# Patient Record
Sex: Male | Born: 1960
Health system: Southern US, Community
[De-identification: ages and names within clinical notes are randomized; demographics above are authoritative.]

## PROBLEM LIST (undated history)

## (undated) DIAGNOSIS — T7840XA Allergy, unspecified, initial encounter: Secondary | ICD-10-CM

## (undated) DIAGNOSIS — G250 Essential tremor: Secondary | ICD-10-CM

## (undated) DIAGNOSIS — F329 Major depressive disorder, single episode, unspecified: Secondary | ICD-10-CM

## (undated) DIAGNOSIS — R3911 Hesitancy of micturition: Secondary | ICD-10-CM

## (undated) DIAGNOSIS — M25569 Pain in unspecified knee: Secondary | ICD-10-CM

## (undated) DIAGNOSIS — G709 Myoneural disorder, unspecified: Secondary | ICD-10-CM

## (undated) DIAGNOSIS — C61 Malignant neoplasm of prostate: Secondary | ICD-10-CM

## (undated) DIAGNOSIS — M7500 Adhesive capsulitis of unspecified shoulder: Secondary | ICD-10-CM

## (undated) DIAGNOSIS — D179 Benign lipomatous neoplasm, unspecified: Secondary | ICD-10-CM

## (undated) DIAGNOSIS — J45909 Unspecified asthma, uncomplicated: Secondary | ICD-10-CM

## (undated) DIAGNOSIS — F32A Depression, unspecified: Secondary | ICD-10-CM

## (undated) DIAGNOSIS — L409 Psoriasis, unspecified: Secondary | ICD-10-CM

## (undated) DIAGNOSIS — E785 Hyperlipidemia, unspecified: Secondary | ICD-10-CM

## (undated) DIAGNOSIS — K219 Gastro-esophageal reflux disease without esophagitis: Secondary | ICD-10-CM

## (undated) DIAGNOSIS — R2689 Other abnormalities of gait and mobility: Secondary | ICD-10-CM

## (undated) HISTORY — DX: Psoriasis, unspecified: L40.9

## (undated) HISTORY — PX: UPPER GASTROINTESTINAL ENDOSCOPY: SHX188

## (undated) HISTORY — PX: COLONOSCOPY: SHX174

## (undated) HISTORY — DX: Adhesive capsulitis of unspecified shoulder: M75.00

## (undated) HISTORY — DX: Benign lipomatous neoplasm, unspecified: D17.9

## (undated) HISTORY — DX: Essential tremor: G25.0

## (undated) HISTORY — DX: Hyperlipidemia, unspecified: E78.5

## (undated) HISTORY — DX: Myoneural disorder, unspecified: G70.9

## (undated) HISTORY — DX: Unspecified asthma, uncomplicated: J45.909

## (undated) HISTORY — DX: Allergy, unspecified, initial encounter: T78.40XA

## (undated) HISTORY — DX: Gastro-esophageal reflux disease without esophagitis: K21.9

## (undated) HISTORY — DX: Other abnormalities of gait and mobility: R26.89

---
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
## 1997-10-07 HISTORY — PX: VASECTOMY: SHX75

## 2000-09-17 ENCOUNTER — Encounter: Payer: Self-pay | Admitting: Internal Medicine

## 2000-09-17 ENCOUNTER — Encounter: Admission: RE | Admit: 2000-09-17 | Discharge: 2000-09-17 | Payer: Self-pay | Admitting: Internal Medicine

## 2001-12-11 ENCOUNTER — Encounter: Payer: Self-pay | Admitting: Internal Medicine

## 2001-12-11 ENCOUNTER — Encounter: Admission: RE | Admit: 2001-12-11 | Discharge: 2001-12-11 | Payer: Self-pay | Admitting: Internal Medicine

## 2002-01-08 ENCOUNTER — Encounter: Admission: RE | Admit: 2002-01-08 | Discharge: 2002-04-08 | Payer: Self-pay | Admitting: Internal Medicine

## 2008-08-25 ENCOUNTER — Ambulatory Visit: Payer: Self-pay | Admitting: Internal Medicine

## 2009-08-01 ENCOUNTER — Ambulatory Visit: Payer: Self-pay | Admitting: Internal Medicine

## 2009-10-19 ENCOUNTER — Ambulatory Visit: Payer: Self-pay | Admitting: Internal Medicine

## 2009-10-30 ENCOUNTER — Ambulatory Visit: Payer: Self-pay | Admitting: Internal Medicine

## 2010-02-12 ENCOUNTER — Ambulatory Visit: Payer: Self-pay | Admitting: Internal Medicine

## 2010-07-23 ENCOUNTER — Ambulatory Visit: Payer: Self-pay | Admitting: Internal Medicine

## 2010-08-06 ENCOUNTER — Ambulatory Visit: Payer: Self-pay | Admitting: Internal Medicine

## 2010-08-06 ENCOUNTER — Encounter: Admission: RE | Admit: 2010-08-06 | Discharge: 2010-08-06 | Payer: Self-pay | Admitting: Internal Medicine

## 2010-08-10 ENCOUNTER — Ambulatory Visit: Payer: Self-pay | Admitting: Internal Medicine

## 2010-08-16 ENCOUNTER — Ambulatory Visit: Payer: Self-pay | Admitting: Internal Medicine

## 2010-08-17 ENCOUNTER — Ambulatory Visit: Payer: Self-pay | Admitting: Internal Medicine

## 2010-09-03 ENCOUNTER — Ambulatory Visit: Payer: Self-pay | Admitting: Internal Medicine

## 2010-09-14 ENCOUNTER — Encounter
Admission: RE | Admit: 2010-09-14 | Discharge: 2010-09-14 | Payer: Self-pay | Source: Home / Self Care | Attending: Allergy | Admitting: Allergy

## 2010-09-27 ENCOUNTER — Ambulatory Visit: Payer: Self-pay | Admitting: Internal Medicine

## 2010-10-19 ENCOUNTER — Ambulatory Visit
Admission: RE | Admit: 2010-10-19 | Discharge: 2010-10-19 | Payer: Self-pay | Source: Home / Self Care | Admitting: Internal Medicine

## 2010-11-15 ENCOUNTER — Ambulatory Visit (INDEPENDENT_AMBULATORY_CARE_PROVIDER_SITE_OTHER): Payer: 59 | Admitting: Internal Medicine

## 2010-11-15 DIAGNOSIS — J019 Acute sinusitis, unspecified: Secondary | ICD-10-CM

## 2010-11-15 DIAGNOSIS — J309 Allergic rhinitis, unspecified: Secondary | ICD-10-CM

## 2010-11-23 DIAGNOSIS — G250 Essential tremor: Secondary | ICD-10-CM

## 2010-11-23 DIAGNOSIS — G252 Other specified forms of tremor: Secondary | ICD-10-CM

## 2011-01-24 ENCOUNTER — Ambulatory Visit (INDEPENDENT_AMBULATORY_CARE_PROVIDER_SITE_OTHER): Payer: 59 | Admitting: Internal Medicine

## 2011-01-24 DIAGNOSIS — J309 Allergic rhinitis, unspecified: Secondary | ICD-10-CM

## 2011-02-20 ENCOUNTER — Telehealth: Payer: Self-pay | Admitting: Internal Medicine

## 2011-02-20 NOTE — Telephone Encounter (Signed)
Offer visit tomorrow or Urgent Care visit today.

## 2011-02-20 NOTE — Telephone Encounter (Signed)
Spoke with patient. Had shingles at age 50. Broke out in rash yesterday, but no vesicles visible.Minimal pain. Does not want to go to Urgent Care. Says he can wait till tomorrow. No fever, but uri symptoms Monday.

## 2011-02-20 NOTE — Telephone Encounter (Signed)
Tried to call patient back today to tell him we would work him in tomorrow. No answer. Message left to call

## 2011-02-21 ENCOUNTER — Encounter: Payer: Self-pay | Admitting: Internal Medicine

## 2011-02-21 ENCOUNTER — Ambulatory Visit (INDEPENDENT_AMBULATORY_CARE_PROVIDER_SITE_OTHER): Payer: 59 | Admitting: Internal Medicine

## 2011-02-21 DIAGNOSIS — B029 Zoster without complications: Secondary | ICD-10-CM

## 2011-02-21 MED ORDER — VALACYCLOVIR HCL 1 G PO TABS: 1000.0000 mg | ORAL_TABLET | Freq: Three times a day (TID) | ORAL | Status: AC

## 2011-02-21 NOTE — Patient Instructions (Signed)
Take  Valtrex as directed. May use Calamine tid topically. Return as needed.

## 2011-02-21 NOTE — Telephone Encounter (Signed)
Patient coming for ov today at 10::00 am

## 2011-02-21 NOTE — Progress Notes (Signed)
  Subjective:    Patient ID: Patrick Sanchez, male    DOB: [birth year], 50 y.o.   MRN: [id number]  HPI Started yesterday, red vesicular rash below left nipple on left trunk. Very fatigued.    Review of Systems     Objective:   Physical Exam    4cm vesicular erythematous rash left anterior trunk    Assessment & Plan:  Herpes zoster- treat with Valtrex one gram tid x 7 days by mouth

## 2011-02-28 ENCOUNTER — Telehealth: Payer: Self-pay

## 2011-02-28 NOTE — Telephone Encounter (Signed)
These types of symptoms may go on for several weeks as nerve pain. Not much to do for it. Pain meds do very little for nerve root pain.

## 2011-02-28 NOTE — Telephone Encounter (Signed)
Patient informed. 

## 2011-03-12 ENCOUNTER — Telehealth: Payer: Self-pay | Admitting: Internal Medicine

## 2011-03-12 NOTE — Telephone Encounter (Signed)
Spoke with patient. Advised that he most likely has a reactive lymph node, probably from recent shingles outbreak. He is afebrile. To monitor lump for next 3 weeks, and call back if it is still present.

## 2011-04-30 ENCOUNTER — Ambulatory Visit (INDEPENDENT_AMBULATORY_CARE_PROVIDER_SITE_OTHER): Payer: 59 | Admitting: Internal Medicine

## 2011-04-30 ENCOUNTER — Encounter: Payer: Self-pay | Admitting: Internal Medicine

## 2011-04-30 DIAGNOSIS — F329 Major depressive disorder, single episode, unspecified: Secondary | ICD-10-CM

## 2011-04-30 DIAGNOSIS — L02436 Carbuncle of left lower limb: Secondary | ICD-10-CM

## 2011-04-30 DIAGNOSIS — L02429 Furuncle of limb, unspecified: Secondary | ICD-10-CM

## 2011-04-30 DIAGNOSIS — E785 Hyperlipidemia, unspecified: Secondary | ICD-10-CM

## 2011-04-30 DIAGNOSIS — J309 Allergic rhinitis, unspecified: Secondary | ICD-10-CM | POA: Insufficient documentation

## 2011-04-30 DIAGNOSIS — K219 Gastro-esophageal reflux disease without esophagitis: Secondary | ICD-10-CM

## 2011-04-30 DIAGNOSIS — G250 Essential tremor: Secondary | ICD-10-CM

## 2011-04-30 DIAGNOSIS — F32A Depression, unspecified: Secondary | ICD-10-CM

## 2011-04-30 DIAGNOSIS — F3289 Other specified depressive episodes: Secondary | ICD-10-CM

## 2011-04-30 NOTE — Patient Instructions (Signed)
Take Keflex 4 times daily for 7 days. Call if not better in 10 days

## 2011-04-30 NOTE — Progress Notes (Signed)
  Subjective:    Patient ID: Patrick Sanchez, male    DOB: 1961-08-08, 50 y.o.   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 central tremor, hyperlipidemia, psoriasis, depression. Over the past several months he's had considerable problems with respiratory infection symptoms. He has been unable to work. He was allergy tested by Dr. Gordon Callas. Was diagnosed with asthma and allergic rhinitis with significant reaction to perennial allergens. Allergy vaccinations were recommended but he has not started those despite having been evaluated December 2011 he had lots of issues with respiratory congestion and inability to work in November 2011. He applied for disability through his employment and he says only part of that was paid. The symptoms continued into December. He had allergic bronchitis and serous otitis media with fatigue. Mother has pancreatic cancer of [place] she's doing fairly well. She was diagnosed late last year. In February 2012, we saw TMs for sinusitis allergic rhinitis and fatigue. He was treated with steroids and Avelox. We saw him again 01/24/1999 coiled with URI symptoms. Was diagnosed with allergic rhinitis sinusitis and serous otitis media. Was treated with Levaquin. He's also had some issues with his daughter who had a rough year in school. She is transferring to a new school this fall. Nail says that he's had some pain in his left axilla and has noticed what he thinks is a swollen lymph node in his left groin area. Continues to be fatigued. He had lab work 08/06/2010 consisting of CBC which was normal and a TSH which was normal. Sinemet was done 08/16/2010 and was normal. Chest x-ray was done 08/06/2010 which was normal. Seems depressed about his job. Under financial stress. Is on Pristiq for depression.     Review of Systems     Objective:   Physical Exam HEENT exam: TMs and pharynx are clear. Neck: Supple  without thyromegaly or adenopathy. Left axilla no adenopathy appreciated but patient complains of pain with palpation. Chest clear. No hepatosplenomegaly masses or tenderness on abdominal exam. Left groin no adenopathy. Carbuncle noted left upper inner thigh.        Assessment & Plan:  Fatigue  Depression  Carbuncle left upper inner thigh  Allergic rhinitis  GE reflux  Hyperlipidemia  Essential tremor  Plan: Treat carbuncle with Keflex 500 mg 4 times daily for 7 days. Reassured patient he has no significant adenopathy. No lab work done today. Patient also has questions about his short-term disability claim being denied. He was given copies of his medical records over the past several months.

## 2011-07-12 ENCOUNTER — Other Ambulatory Visit: Payer: Self-pay

## 2011-07-12 MED ORDER — MONTELUKAST SODIUM 10 MG PO TABS: 10.0000 mg | ORAL_TABLET | Freq: Every day | ORAL | Status: DC

## 2011-09-26 ENCOUNTER — Ambulatory Visit (INDEPENDENT_AMBULATORY_CARE_PROVIDER_SITE_OTHER): Payer: 59 | Admitting: Internal Medicine

## 2011-09-26 ENCOUNTER — Encounter: Payer: Self-pay | Admitting: Internal Medicine

## 2011-09-26 VITALS — BP 149/94 | HR 116 | Temp 100.2°F | Wt 157.0 lb

## 2011-09-26 DIAGNOSIS — J111 Influenza due to unidentified influenza virus with other respiratory manifestations: Secondary | ICD-10-CM

## 2011-09-26 DIAGNOSIS — J101 Influenza due to other identified influenza virus with other respiratory manifestations: Secondary | ICD-10-CM

## 2011-09-26 DIAGNOSIS — J40 Bronchitis, not specified as acute or chronic: Secondary | ICD-10-CM

## 2011-09-26 NOTE — Progress Notes (Signed)
  Subjective:    Patient ID: Patrick Sanchez, male    DOB: [birth year], 50 y.o.   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 Did not take influenza immunization this year. Was out of work yesterday and today. Scratchy throat. No wheezing. No shortness of breath.    Review of Systems     Objective:   Physical Exam HEENT exam: Pharynx slightly injected; TMs clear; neck is supple; chest clear. Has congested cough.        Assessment & Plan:  Influenza  Plan: Levaquin 500 milligrams daily for 10 days; Hycodan 8 ounces 1 teaspoon by mouth every 6 hours when necessary cough; Tamiflu 75 mg twice daily for 5 days. Note to be out of work until without fever for 24 hours.

## 2011-09-26 NOTE — Patient Instructions (Signed)
Take Tylenol as needed for fever. To start Tamiflu 75 mg twice daily for 5 days. Take Levaquin for bronchitis for 10 days. Use cough syrup sparingly. Return to work when without fever for 24 hours.

## 2011-12-30 ENCOUNTER — Ambulatory Visit (INDEPENDENT_AMBULATORY_CARE_PROVIDER_SITE_OTHER): Payer: 59 | Admitting: Internal Medicine

## 2011-12-30 ENCOUNTER — Encounter: Payer: Self-pay | Admitting: Internal Medicine

## 2011-12-30 VITALS — BP 156/96 | HR 92 | Temp 98.8°F | Wt 162.0 lb

## 2011-12-30 DIAGNOSIS — J40 Bronchitis, not specified as acute or chronic: Secondary | ICD-10-CM

## 2011-12-30 DIAGNOSIS — J329 Chronic sinusitis, unspecified: Secondary | ICD-10-CM

## 2012-01-04 NOTE — Patient Instructions (Signed)
Take antibiotics as prescribed. Cough medicine if needed for cough. Return if not better in 2 weeks

## 2012-01-04 NOTE — Progress Notes (Signed)
  Subjective:    Patient ID: Patrick Sanchez, male    DOB: 1960/11/24, 51 y.o.   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 History of frequent respiratory infections. Has come down with respiratory congestion, sore throat, cough and fatigue. History of GE reflux and depression.    Review of Systems     Objective:   Physical Exam HEENT exam: Pharynx is slightly injected. TMs are clear. He sounds hoarse and congested. Neck is supple without significant adenopathy. Chest clear.        Assessment & Plan:  Sinusitis  Bronchitis  Plan: Levaquin 500 milligrams daily for 10 days. Hycodan 8 ounces 1 teaspoon by mouth every 6 hours when necessary cough.

## 2012-04-01 ENCOUNTER — Other Ambulatory Visit: Payer: Self-pay | Admitting: Internal Medicine

## 2012-04-02 ENCOUNTER — Other Ambulatory Visit: Payer: Self-pay

## 2012-04-02 MED ORDER — FLUTICASONE PROPIONATE 50 MCG/ACT NA SUSP: 2.0000 | Freq: Every day | NASAL | Status: DC

## 2012-05-08 ENCOUNTER — Ambulatory Visit (INDEPENDENT_AMBULATORY_CARE_PROVIDER_SITE_OTHER): Payer: 59 | Admitting: Internal Medicine

## 2012-05-08 ENCOUNTER — Encounter: Payer: Self-pay | Admitting: Internal Medicine

## 2012-05-08 VITALS — BP 140/96 | HR 76 | Temp 98.4°F | Wt 155.5 lb

## 2012-05-08 DIAGNOSIS — R2230 Localized swelling, mass and lump, unspecified upper limb: Secondary | ICD-10-CM

## 2012-05-08 DIAGNOSIS — R229 Localized swelling, mass and lump, unspecified: Secondary | ICD-10-CM

## 2012-05-08 NOTE — Progress Notes (Signed)
  Subjective:    Patient ID: Patrick Sanchez, male    DOB: 30-Sep-1961, 51 y.o.   MRN: [id number]  HPI 51 year old white male with history of essential tremor came in acutely late this afternoon saying that he took a shower and noticed a bulge in his right triceps muscle area. Denies pain  or injury. No history of heavy lifting. Does not lift weights for exercise. In fact doesn't exercise at all. Doesn't recall any insect bites.  History of GE reflux and anxiety. History of hyperlipidemia.  Patient looked online and thought he might have a triceps rupture.  Review of Systems     Objective:   Physical Exam patient has bulge right triceps area. There is no erythema, no evidence of insect bite, no increased warmth. There is no right axillary adenopathy. There is no pain to palpation. Mass is soft and mobile. Borders are not palpable. There is no right upper extremity weakness. Muscle strength is 5 over 5 in all retention in the right upper extremity.        Assessment & Plan:  Mass right triceps area  Plan: Patient will go to [facility] walk-in clinic at 5:30 PM for further evaluation.

## 2012-05-08 NOTE — Patient Instructions (Addendum)
Go to [facility] walk-in clinic at 5:30 PM to evaluate triceps area.

## 2012-05-11 ENCOUNTER — Telehealth: Payer: Self-pay | Admitting: Internal Medicine

## 2012-05-11 ENCOUNTER — Telehealth: Payer: Self-pay

## 2012-05-11 NOTE — Telephone Encounter (Signed)
Patient saw physicians assistant at sports medicine clinic Friday but did not have full evaluation. Apparently office was in the process of moving. They indicated he would need to see orthopedist. We have arranged for him to see Dr. Darrelyn Hillock at 2 PM today.

## 2012-05-11 NOTE — Telephone Encounter (Signed)
Note done per Dr. Lenord Fellers

## 2012-07-06 DIAGNOSIS — K219 Gastro-esophageal reflux disease without esophagitis: Secondary | ICD-10-CM | POA: Insufficient documentation

## 2012-07-06 DIAGNOSIS — D179 Benign lipomatous neoplasm, unspecified: Secondary | ICD-10-CM | POA: Insufficient documentation

## 2012-07-06 DIAGNOSIS — F1010 Alcohol abuse, uncomplicated: Secondary | ICD-10-CM | POA: Insufficient documentation

## 2013-06-10 ENCOUNTER — Ambulatory Visit (INDEPENDENT_AMBULATORY_CARE_PROVIDER_SITE_OTHER): Payer: BC Managed Care – PPO | Admitting: Internal Medicine

## 2013-06-10 ENCOUNTER — Encounter: Payer: Self-pay | Admitting: Internal Medicine

## 2013-06-10 VITALS — BP 130/90 | Temp 97.9°F | Wt 152.0 lb

## 2013-06-10 DIAGNOSIS — F3289 Other specified depressive episodes: Secondary | ICD-10-CM

## 2013-06-10 DIAGNOSIS — F32A Depression, unspecified: Secondary | ICD-10-CM

## 2013-06-10 DIAGNOSIS — F329 Major depressive disorder, single episode, unspecified: Secondary | ICD-10-CM

## 2013-06-10 DIAGNOSIS — D1779 Benign lipomatous neoplasm of other sites: Secondary | ICD-10-CM

## 2013-06-10 DIAGNOSIS — M19079 Primary osteoarthritis, unspecified ankle and foot: Secondary | ICD-10-CM

## 2013-06-10 DIAGNOSIS — D1720 Benign lipomatous neoplasm of skin and subcutaneous tissue of unspecified limb: Secondary | ICD-10-CM

## 2013-06-10 MED ORDER — BUPROPION HCL ER (XL) 300 MG PO TB24: 300.0000 mg | ORAL_TABLET | Freq: Every day | ORAL | Status: DC

## 2013-06-14 NOTE — Patient Instructions (Addendum)
Reassure him about lipoma of arm and bony prominence metatarsal aspect of foot. Start Wellbutrin XL 300 mg daily. Call with progress report in 4-6 weeks

## 2013-06-14 NOTE — Progress Notes (Signed)
  Subjective:    Patient ID: Patrick Sanchez, male    DOB: [birth year], 52 y.o.   MRN: [id number]  HPI 52 year old White male has been unemployed for the past 11 months. 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 Says the past several months have allowed him to spend time with his younger daughter who was having some mental issues. Says she is doing much better. He is now starting to look for work. He had thought about returning to school at [employer]. Has become a bit depressed recently. He used to take Wellbutrin. He has an upcoming court date for DUI charge which has been put off for almost a year. Denies drinking to excess.  Also has 5 inch mass right inner arm which is not changed. He merely wants it looked at. Appears to be a lipoma. It is not tender to palpation or causing him any problems.  Also has nodule bottom of his foot which is probably a bony prominence and once again is not tender or causing him any problems    Review of Systems     Objective:   Physical Exam spent 25 minutes speaking with patient about his family situation and job situation. He has no suicidal ideations. He merely is somewhat depressed about his job situation. Lipoma right inner upper arm noted. Bony prominence on the foot which seems to be benign distal metatarsal area.        Assessment & Plan:  Depression  Bony prominence plantar aspect of left foot  Lipoma right upper arm  Plan: Begin Wellbutrin XL 300 mg generic daily. Call in 4-6 weeks with progress report on this.

## 2014-02-07 ENCOUNTER — Other Ambulatory Visit: Payer: Self-pay

## 2014-02-07 ENCOUNTER — Telehealth: Payer: Self-pay | Admitting: Internal Medicine

## 2014-02-07 MED ORDER — BUPROPION HCL ER (XL) 300 MG PO TB24: 300.0000 mg | ORAL_TABLET | Freq: Every day | ORAL | Status: DC

## 2014-02-07 NOTE — Telephone Encounter (Signed)
Linda e-scribed refill for 90 days [facility].  Wife informed.

## 2014-07-24 ENCOUNTER — Other Ambulatory Visit: Payer: Self-pay | Admitting: Internal Medicine

## 2014-08-16 ENCOUNTER — Other Ambulatory Visit: Payer: BC Managed Care – PPO | Admitting: Internal Medicine

## 2014-08-16 DIAGNOSIS — E785 Hyperlipidemia, unspecified: Secondary | ICD-10-CM

## 2014-08-16 DIAGNOSIS — Z0000 Encounter for general adult medical examination without abnormal findings: Secondary | ICD-10-CM

## 2014-08-16 DIAGNOSIS — Z13220 Encounter for screening for lipoid disorders: Secondary | ICD-10-CM

## 2014-08-16 DIAGNOSIS — Z125 Encounter for screening for malignant neoplasm of prostate: Secondary | ICD-10-CM

## 2014-08-16 DIAGNOSIS — Z130 Encounter for screening for diseases of the blood and blood-forming organs and certain disorders involving the immune mechanism: Secondary | ICD-10-CM

## 2014-08-16 LAB — CBC WITH DIFFERENTIAL/PLATELET
Basophils Absolute: 0.1 10*3/uL (ref 0.0–0.1)
Basophils Relative: 1 % (ref 0–1)
Eosinophils Absolute: 0.2 10*3/uL (ref 0.0–0.7)
Eosinophils Relative: 3 % (ref 0–5)
HCT: 47.9 % (ref 39.0–52.0)
Hemoglobin: 16.6 g/dL (ref 13.0–17.0)
Lymphocytes Relative: 22 % (ref 12–46)
Lymphs Abs: 1.6 10*3/uL (ref 0.7–4.0)
MCH: 31.1 pg (ref 26.0–34.0)
MCHC: 34.7 g/dL (ref 30.0–36.0)
MCV: 89.9 fL (ref 78.0–100.0)
Monocytes Absolute: 0.5 10*3/uL (ref 0.1–1.0)
Monocytes Relative: 7 % (ref 3–12)
Neutro Abs: 4.8 10*3/uL (ref 1.7–7.7)
Neutrophils Relative %: 67 % (ref 43–77)
Platelets: 236 10*3/uL (ref 150–400)
RBC: 5.33 MIL/uL (ref 4.22–5.81)
RDW: 12.8 % (ref 11.5–15.5)
WBC: 7.1 10*3/uL (ref 4.0–10.5)

## 2014-08-16 LAB — LIPID PANEL
Cholesterol: 248 mg/dL — ABNORMAL HIGH (ref 0–200)
HDL: 47 mg/dL (ref >39)
LDL Cholesterol: 175 mg/dL — ABNORMAL HIGH (ref 0–99)
Total CHOL/HDL Ratio: 5.3 Ratio
Triglycerides: 131 mg/dL (ref <150)
VLDL: 26 mg/dL (ref 0–40)

## 2014-08-16 LAB — COMPREHENSIVE METABOLIC PANEL
ALT: 25 U/L (ref 0–53)
AST: 17 U/L (ref 0–37)
Albumin: 4.1 g/dL (ref 3.5–5.2)
Alkaline Phosphatase: 55 U/L (ref 39–117)
BUN: 14 mg/dL (ref 6–23)
CO2: 28 mEq/L (ref 19–32)
Calcium: 9.3 mg/dL (ref 8.4–10.5)
Chloride: 100 mEq/L (ref 96–112)
Creat: 0.99 mg/dL (ref 0.50–1.35)
Glucose, Bld: 93 mg/dL (ref 70–99)
Potassium: 4.3 mEq/L (ref 3.5–5.3)
Sodium: 137 mEq/L (ref 135–145)
Total Bilirubin: 0.4 mg/dL (ref 0.2–1.2)
Total Protein: 6.4 g/dL (ref 6.0–8.3)

## 2014-08-17 LAB — PSA: PSA: 9.47 ng/mL — ABNORMAL HIGH (ref <=4.00)

## 2014-09-20 ENCOUNTER — Ambulatory Visit (INDEPENDENT_AMBULATORY_CARE_PROVIDER_SITE_OTHER): Payer: BC Managed Care – PPO | Admitting: Internal Medicine

## 2014-09-20 ENCOUNTER — Telehealth: Payer: Self-pay

## 2014-09-20 ENCOUNTER — Encounter: Payer: Self-pay | Admitting: Internal Medicine

## 2014-09-20 ENCOUNTER — Ambulatory Visit
Admission: RE | Admit: 2014-09-20 | Discharge: 2014-09-20 | Disposition: A | Discharge status: Home / Self Care | Payer: BC Managed Care – PPO | Source: Ambulatory Visit | Attending: Internal Medicine | Admitting: Internal Medicine

## 2014-09-20 VITALS — BP 116/76 | HR 84 | Temp 97.4°F | Wt 148.0 lb

## 2014-09-20 DIAGNOSIS — R0789 Other chest pain: Secondary | ICD-10-CM

## 2014-09-20 DIAGNOSIS — R222 Localized swelling, mass and lump, trunk: Secondary | ICD-10-CM

## 2014-09-20 NOTE — Telephone Encounter (Signed)
Patient informed of xray results.  Advised to take aleve bid x 10 days.  CT if no better.

## 2014-09-20 NOTE — Telephone Encounter (Signed)
-----   Message from Elby Showers, MD sent at 09/20/2014  3:47 PM EST ----- I think this is related to rib/ sternum. Take Aleve twice daily x 10 days and if no better, we can get CT of that area.

## 2014-10-03 ENCOUNTER — Ambulatory Visit (INDEPENDENT_AMBULATORY_CARE_PROVIDER_SITE_OTHER): Payer: BC Managed Care – PPO | Admitting: Internal Medicine

## 2014-10-03 ENCOUNTER — Encounter: Payer: Self-pay | Admitting: Internal Medicine

## 2014-10-03 VITALS — BP 130/80 | HR 86 | Temp 98.0°F | Wt 146.0 lb

## 2014-10-03 DIAGNOSIS — Z872 Personal history of diseases of the skin and subcutaneous tissue: Secondary | ICD-10-CM

## 2014-10-03 DIAGNOSIS — G250 Essential tremor: Secondary | ICD-10-CM

## 2014-10-03 DIAGNOSIS — Z0000 Encounter for general adult medical examination without abnormal findings: Secondary | ICD-10-CM

## 2014-10-03 DIAGNOSIS — R972 Elevated prostate specific antigen [PSA]: Secondary | ICD-10-CM

## 2014-10-03 DIAGNOSIS — E785 Hyperlipidemia, unspecified: Secondary | ICD-10-CM

## 2014-10-03 MED ORDER — SIMVASTATIN 10 MG PO TABS: 10.0000 mg | ORAL_TABLET | Freq: Every day | ORAL | Status: DC

## 2014-10-03 NOTE — Patient Instructions (Signed)
Patient reassured this does not appear to be be a serious lesion. Continue to observe. Has appointment for physical exam in the near future and we can follow-up at that time.

## 2014-10-03 NOTE — Progress Notes (Signed)
   Subjective:    Patient ID: Patrick Sanchez, male    DOB: 10-18-1960, 53 y.o.   MRN: [id number]  HPI  Has recently noted tender prominent painful area in lower chest right rib cage area adjacent to the sternum. Denies any injury.    Review of Systems     Objective:   Physical Exam  Has prominence right lower rib cage area adjacent to right sternum that is tender to touch. Also is tender at xiphoid process.      Assessment & Plan:  Chest wall pain  Plan: Patient is concerned about the prominence he feels as well as the tenderness. Will do x-ray of area to see if there is a lytic lesion or calcification.  Addendum: No significant abnormality noted in the area on x-ray. Continue to observe.

## 2014-10-03 NOTE — Progress Notes (Signed)
Subjective:    Patient ID: Patrick Sanchez, male    DOB: 08/09/1961, 53 y.o.   MRN: [id number]  HPI 53 year old White Male has not been seen here for physical exam in a number of years. He has a history of essential tremor and hyperlipidemia. He is not taking any medication for tremor or hyperlipidemia this point in time. Used to take statin medication for hyperlipidemia. History of GE reflux and allergic rhinitis. History of depression treated with Wellbutrin. Was here recently regarding a "lump "lower right rib cage. This was x-ray and was found not to be significant. It was tender at the time but has improved in tenderness.  He's lost 8 pounds in's 2012.  Social history: He is married. Has 2 daughters. One daughter is living and working in [place]. The other daughter is a Equities trader in high school but wants to live in [place]. 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 He has not worked in a couple of years. He consumes alcohol daily. Does not smoke. Had DUI and lost license for one year.Gets license back soon. Had counseling at [facility] for alcohol consumption.  Family history: Mother died with pancreatic cancer. Father with history of diabetes and MI status post CABG. Last couple of years of his life father was bedridden apparently due to heart failure.  History of allergic rhinitis. Was allergy tested by Dr. Donneta Romberg in the past and had reactions to perennial allergens. Allergy vaccinations were recommended but patient never pursued those. History of psoriasis. Right inguinal hernia repair 1972. Ganglion cyst removed from left wrist 1971. Vasectomy 1999 by Dr. Joelyn Oms who has retired. 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 Right elbow epicondylitis 2003. Negative Cardiolite study in 2003 by Dr. Ron Parker.  He is allergic to Sulfa.  Lipid panel on file from October 2010 showed a total cholesterol of 244 with an LDL  cholesterol of 148 on Zocor 40 mg daily. In 2009 total cholesterol was 293 and LDL cholesterol 200. He was started on Zocor 20 mg daily at that time.     Review of Systems  Constitutional: Negative.   HENT: Negative.   Respiratory: Negative.   Cardiovascular: Negative.   Gastrointestinal: Negative.   Endocrine: Negative.   Genitourinary: Negative for dysuria, urgency, frequency, flank pain and decreased urine volume.  Allergic/Immunologic: Positive for environmental allergies.  Neurological:       Essential tremor left hand worse than right  Psychiatric/Behavioral:       History of depression       Objective:   Physical Exam  Constitutional: He is oriented to person, place, and time. He appears well-developed and well-nourished. No distress.  HENT:  Head: Normocephalic and atraumatic.  Right Ear: External ear normal.  Left Ear: External ear normal.  Mouth/Throat: Oropharynx is clear and moist.  Eyes: Conjunctivae and EOM are normal. Pupils are equal, round, and reactive to light. Right eye exhibits no discharge. Left eye exhibits no discharge. No scleral icterus.  Neck: Neck supple. No tracheal deviation present.  Cardiovascular: Normal rate, regular rhythm, normal heart sounds and intact distal pulses.   No murmur heard. Pulmonary/Chest: Effort normal and breath sounds normal. He has no wheezes. He has no rales.  Abdominal: Soft. Bowel sounds are normal. He exhibits no distension and no mass. There is no tenderness. There is no rebound and no guarding.  Genitourinary:  Very boggy prostate without nodules  Musculoskeletal: He  exhibits no edema.  Lymphadenopathy:    He has no cervical adenopathy.  Neurological: He is alert and oriented to person, place, and time. He has normal reflexes. Coordination normal.  Fine tremor more pronounced in left hand and right hand  Skin: Skin is warm and dry. He is not diaphoretic.  Psoriasis left elbow. Facial plethora  Psychiatric: He has  a normal mood and affect. His behavior is normal. Judgment and thought content normal.  Vitals reviewed.         Assessment & Plan:  Significantly elevated PSA 9.47.  I have no previous PSA to compare. His prostate is boggy. He has no urinary symptoms. To have urology consultation. Patient unable to produce urine specimen today. Says he'll bring specimen back from home in the near future.  Health maintenance: Recommend annual flu vaccine. He wants to wait today because he is flying to [employer]. He needs to have screening colonoscopy.  Hyperlipidemia-restart statin medication Zocor 10 mg daily. Has elevated total cholesterol at 248 and LDL cholesterol elevated at 175. Recheck lipid panel liver functions with office visit in 3 months.  Essential tremor-patient currently does not want to take  propranolol  History of depression-treated with Wellbutrin

## 2014-10-03 NOTE — Patient Instructions (Addendum)
Appt with urologist for elevated PSA. Start Zocor 10 mg daily. Follow-up with repeat fasting lipid panel and liver functions in 3 months.. Consider colonoscopy after urology evaluation.

## 2014-10-10 ENCOUNTER — Telehealth (INDEPENDENT_AMBULATORY_CARE_PROVIDER_SITE_OTHER): Payer: Self-pay | Admitting: *Deleted

## 2014-10-10 DIAGNOSIS — Z0000 Encounter for general adult medical examination without abnormal findings: Secondary | ICD-10-CM

## 2014-10-10 LAB — POCT URINALYSIS DIPSTICK
Bilirubin, UA: NEGATIVE
Blood, UA: NEGATIVE
Glucose, UA: NEGATIVE
Ketones, UA: NEGATIVE
Leukocytes, UA: NEGATIVE
Nitrite, UA: NEGATIVE
Protein, UA: NEGATIVE
Spec Grav, UA: 1.015
Urobilinogen, UA: NEGATIVE
pH, UA: 5

## 2014-10-10 NOTE — Telephone Encounter (Signed)
Patient dropped off urine for routine POC testing. Urine was normal pateint notified

## 2014-10-11 ENCOUNTER — Telehealth: Payer: Self-pay | Admitting: Internal Medicine

## 2014-10-11 MED ORDER — BUPROPION HCL ER (XL) 300 MG PO TB24: 300.0000 mg | ORAL_TABLET | Freq: Every day | ORAL | Status: DC

## 2014-10-11 NOTE — Telephone Encounter (Signed)
He needs his Wellbutrin XL 300 mg refilled and states that it needs prior auth.    [facility] [place]

## 2014-10-11 NOTE — Telephone Encounter (Signed)
Please call pharmacy and refill generic Wellbutrin 300 mg XL for one year. Pt thinks it needs prior authorization. Do not know why. It is generic

## 2014-11-24 ENCOUNTER — Encounter: Payer: Self-pay | Admitting: Internal Medicine

## 2014-11-24 ENCOUNTER — Ambulatory Visit (INDEPENDENT_AMBULATORY_CARE_PROVIDER_SITE_OTHER): Payer: BLUE CROSS/BLUE SHIELD | Admitting: Internal Medicine

## 2014-11-24 VITALS — BP 138/82 | HR 114 | Temp 98.0°F | Wt 148.0 lb

## 2014-11-24 DIAGNOSIS — C61 Malignant neoplasm of prostate: Secondary | ICD-10-CM | POA: Diagnosis not present

## 2014-11-24 DIAGNOSIS — F411 Generalized anxiety disorder: Secondary | ICD-10-CM | POA: Diagnosis not present

## 2014-11-24 DIAGNOSIS — M25562 Pain in left knee: Secondary | ICD-10-CM | POA: Diagnosis not present

## 2014-11-24 DIAGNOSIS — G250 Essential tremor: Secondary | ICD-10-CM | POA: Diagnosis not present

## 2014-11-24 MED ORDER — MELOXICAM 15 MG PO TABS: 15.0000 mg | ORAL_TABLET | Freq: Every day | ORAL | Status: DC

## 2014-11-24 MED ORDER — HYDROCODONE-ACETAMINOPHEN 10-325 MG PO TABS: 1.0000 | ORAL_TABLET | Freq: Four times a day (QID) | ORAL | Status: DC | PRN

## 2014-11-24 MED ORDER — CLONAZEPAM 0.5 MG PO TABS: 0.5000 mg | ORAL_TABLET | Freq: Two times a day (BID) | ORAL | Status: DC | PRN

## 2014-12-05 NOTE — Progress Notes (Signed)
   Subjective:    Patient ID: Patrick Sanchez, male    DOB: 08/03/1961, 54 y.o.   MRN: [id number]  HPI Patient has been diagnosed with prostate cancer. Further recommendations regarding treatment are to follow from neurologist but  he is considering surgery. Recently was in the dog park with his dog and another dog ran and struck him in the left medial knee which was excruciatingly painful. He thought initially it would get better but it has not. Continues to hurt him.  He and wife are planning to redo their house since they sold their beach house.  Patient is having some issues with anxiety of a prostate cancer diagnosis and would like cement anxiety medication.    Review of Systems     Objective:   Physical Exam He has an essential tremor which is long-standing. Chest clear to auscultation. Cardiac exam regular rate and rhythm. Extremities without edema. He has tenderness left knee lateral joint line and lateral collateral ligament area       Assessment & Plan:  Anxiety secondary to prostate cancer diagnosis  Essential tremor-long-standing  Left knee injury-perhaps just a contusion but consider internal derangement  Plan: Mobic 15 mg daily. Ice knee for 20 minutes once or twice daily. For anxiety Klonopin 0.5 mg twice daily as needed for anxiety. If the knee pain does not improve, orthopedic consultation.

## 2014-12-26 ENCOUNTER — Other Ambulatory Visit: Payer: BC Managed Care – PPO | Admitting: Internal Medicine

## 2014-12-27 ENCOUNTER — Ambulatory Visit (INDEPENDENT_AMBULATORY_CARE_PROVIDER_SITE_OTHER): Payer: BLUE CROSS/BLUE SHIELD | Admitting: Internal Medicine

## 2014-12-27 ENCOUNTER — Encounter: Payer: Self-pay | Admitting: Internal Medicine

## 2014-12-27 VITALS — BP 136/84 | HR 111 | Temp 97.4°F | Wt 143.0 lb

## 2014-12-27 DIAGNOSIS — M25562 Pain in left knee: Secondary | ICD-10-CM

## 2014-12-27 DIAGNOSIS — E785 Hyperlipidemia, unspecified: Secondary | ICD-10-CM | POA: Diagnosis not present

## 2014-12-27 DIAGNOSIS — F411 Generalized anxiety disorder: Secondary | ICD-10-CM

## 2014-12-27 DIAGNOSIS — G250 Essential tremor: Secondary | ICD-10-CM

## 2014-12-27 DIAGNOSIS — C61 Malignant neoplasm of prostate: Secondary | ICD-10-CM

## 2014-12-27 MED ORDER — HYDROCODONE-ACETAMINOPHEN 10-325 MG PO TABS: 1.0000 | ORAL_TABLET | Freq: Four times a day (QID) | ORAL | Status: DC | PRN

## 2014-12-27 NOTE — Patient Instructions (Signed)
Orthopedic appointment for left knee pain. Hydrocodone refilled. Follow-up with Dr. Louis Meckel regarding prostate cancer. Physical exam due after 10/04/2015

## 2014-12-27 NOTE — Progress Notes (Signed)
   Subjective:    Patient ID: Patrick Sanchez, male    DOB: [birth year], 54 y.o.   MRN: [id number]  HPI  Patient was supposed to be  here for follow-up of hyperlipidemia today but he's been eating take-out for the past 3 months because they have been remodeling their kitchen. He doesn't want have lipid panel done today. He had physical exam in November showing significant hyperlipidemia. He has been to urologist. He's  going to see radiation oncologist regarding possible treatment for prostate cancer. Right now he's leaning towards robotic surgery. Thinks he may want to have the surgery in June after he takes a couple of trips  Also when he was here in February he relayed that a dog had struck him hard in the left knee medial aspect while he was visiting with his dog  in the dog park. He says now he feels he has some instability in his knee. He has hydrocodone for pain. He's planning a trip to [place] and needs a refill on hydrocodone in order to be able to walk around the city. I want him see an orthopedist.  With regard anxiety, he has plenty of Klonopin on hand as not examined. Spent 15 minutes speaking with him about these issues today. he's not been taking it very much      Review of Systems     Objective:   Physical Exam  Not examined      Assessment & Plan:  Left knee pain-get orthopedic appointment. Has hydrocodone/APAP to take for pain  Anxiety-stable on when necessary Klonopin  Prostate cancer-being handled by Dr. Louis Meckel  Hyperlipidemia-needs follow-up in the next few months  Plan: See above. He needs to be on a routine schedule with his diet before we recheck his lipid panel. Kitchen is still being remodeled. It actually may be Fall before he is back on a normal schedule. Physical exam is due after 10/04/2015.

## 2014-12-28 ENCOUNTER — Telehealth: Payer: Self-pay | Admitting: *Deleted

## 2014-12-28 DIAGNOSIS — M25562 Pain in left knee: Secondary | ICD-10-CM

## 2014-12-28 NOTE — Telephone Encounter (Signed)
Referral for patient to see Dr Justice Britain sent to [facility] with copy of office notes

## 2015-01-03 NOTE — Patient Instructions (Signed)
Take Mobic 15 mg daily as anti-inflammatory for knee pain. Ice knee for 20 minutes twice daily. Klonopin 0.5 mg twice daily as needed for anxiety.

## 2015-01-17 ENCOUNTER — Ambulatory Visit: Payer: Self-pay | Admitting: Radiation Oncology

## 2015-01-17 ENCOUNTER — Ambulatory Visit: Payer: Self-pay

## 2015-02-13 ENCOUNTER — Other Ambulatory Visit: Payer: Self-pay | Admitting: Urology

## 2015-03-24 NOTE — Patient Instructions (Addendum)
YOUR PROCEDURE IS SCHEDULED ON : 03/29/15  REPORT TO [facility] MAIN ENTRANCE FOLLOW SIGNS TO SHORT STAY CENTER AT :  5:30 AM  CALL THIS NUMBER IF YOU HAVE PROBLEMS THE MORNING OF SURGERY [phone]  REMEMBER:ONLY 1 PER PERSON MAY GO TO SHORT STAY WITH YOU TO GET READY THE MORNING OF YOUR SURGERY  DO NOT EAT FOOD OR DRINK LIQUIDS AFTER MIDNIGHT  TAKE THESE MEDICINES THE MORNING OF SURGERY:  WELLBUTRIN / MAY TAKE CLONAZEPAM / OR HYDROCODONE IF NEEDED  YOU MAY NOT HAVE ANY METAL ON YOUR BODY INCLUDING HAIR PINS AND PIERCING'S. DO NOT WEAR JEWELRY, MAKEUP, LOTIONS, POWDERS OR PERFUMES. DO NOT WEAR NAIL POLISH. DO NOT SHAVE 48 HRS PRIOR TO SURGERY. MEN MAY SHAVE FACE AND NECK.  DO NOT [place]. [facility] IS NOT RESPONSIBLE FOR VALUABLES.  CONTACTS, DENTURES OR PARTIALS MAY NOT BE WORN TO SURGERY. LEAVE SUITCASE IN CAR. CAN BE BROUGHT TO ROOM AFTER SURGERY.  PATIENTS DISCHARGED THE DAY OF SURGERY WILL NOT BE ALLOWED TO DRIVE HOME.  PLEASE READ OVER THE FOLLOWING INSTRUCTION SHEETS _________________________________________________________________________________                                          [facility] - PREPARING FOR SURGERY  Before surgery, you can play an important role.  Because skin is not sterile, your skin needs to be as free of germs as possible.  You can reduce the number of germs on your skin by washing with CHG (chlorahexidine gluconate) soap before surgery.  CHG is an antiseptic cleaner which kills germs and bonds with the skin to continue killing germs even after washing. Please DO NOT use if you have an allergy to CHG or antibacterial soaps.  If your skin becomes reddened/irritated stop using the CHG and inform your nurse when you arrive at Short Stay. Do not shave (including legs and underarms) for at least 48 hours prior to the first CHG shower.  You may shave your face. Please follow these instructions carefully:   1.   Shower with CHG Soap the night before surgery and the  morning of Surgery.   2.  If you choose to wash your hair, wash your hair first as usual with your  normal  Shampoo.   3.  After you shampoo, rinse your hair and body thoroughly to remove the  shampoo.                                         4.  Use CHG as you would any other liquid soap.  You can apply chg directly  to the skin and wash . Gently wash with scrungie or clean wascloth    5.  Apply the CHG Soap to your body ONLY FROM THE NECK DOWN.   Do not use on open                           Wound or open sores. Avoid contact with eyes, ears mouth and genitals (private parts).                        Genitals (private parts) with your normal soap.  6.  Wash thoroughly, paying special attention to the area where your surgery  will be performed.   7.  Thoroughly rinse your body with warm water from the neck down.   8.  DO NOT shower/wash with your normal soap after using and rinsing off  the CHG Soap .                9.  Pat yourself dry with a clean towel.             10.  Wear clean night clothes to bed after shower             11.  Place clean sheets on your bed the night of your first shower and do not  sleep with pets.  Day of Surgery : Do not apply any lotions/deodorants the morning of surgery.  Please wear clean clothes to the hospital/surgery center.  FAILURE TO FOLLOW THESE INSTRUCTIONS MAY RESULT IN THE CANCELLATION OF YOUR SURGERY    PATIENT SIGNATURE_________________________________  ______________________________________________________________________    WHAT IS A BLOOD TRANSFUSION? Blood Transfusion Information  A transfusion is the replacement of blood or some of its parts. Blood is made up of multiple cells which provide different functions.  Red blood cells carry oxygen and are used for blood loss replacement.  White blood cells fight against infection.  Platelets control  bleeding.  Plasma helps clot blood.  Other blood products are available for specialized needs, such as hemophilia or other clotting disorders. BEFORE THE TRANSFUSION  Who gives blood for transfusions?   Healthy volunteers who are fully evaluated to make sure their blood is safe. This is blood bank blood. Transfusion therapy is the safest it has ever been in the practice of medicine. Before blood is taken from a donor, a complete history is taken to make sure that person has no history of diseases nor engages in risky social behavior (examples are intravenous drug use or sexual activity with multiple partners). The donor's travel history is screened to minimize risk of transmitting infections, such as malaria. The donated blood is tested for signs of infectious diseases, such as HIV and hepatitis. The blood is then tested to be sure it is compatible with you in order to minimize the chance of a transfusion reaction. If you or a relative donates blood, this is often done in anticipation of surgery and is not appropriate for emergency situations. It takes many days to process the donated blood. RISKS AND COMPLICATIONS Although transfusion therapy is very safe and saves many lives, the main dangers of transfusion include:   Getting an infectious disease.  Developing a transfusion reaction. This is an allergic reaction to something in the blood you were given. Every precaution is taken to prevent this. The decision to have a blood transfusion has been considered carefully by your caregiver before blood is given. Blood is not given unless the benefits outweigh the risks. AFTER THE TRANSFUSION  Right after receiving a blood transfusion, you will usually feel much better and more energetic. This is especially true if your red blood cells have gotten low (anemic). The transfusion raises the level of the red blood cells which carry oxygen, and this usually causes an energy increase.  The nurse  administering the transfusion will monitor you carefully for complications. HOME CARE INSTRUCTIONS  No special instructions are needed after a transfusion. You may find your energy is better. Speak with your caregiver about any limitations on activity for underlying diseases you may have.  SEEK MEDICAL CARE IF:   Your condition is not improving after your transfusion.  You develop redness or irritation at the intravenous (IV) site. SEEK IMMEDIATE MEDICAL CARE IF:  Any of the following symptoms occur over the next 12 hours:  Shaking chills.  You have a temperature by mouth above 102 F (38.9 C), not controlled by medicine.  Chest, back, or muscle pain.  People around you feel you are not acting correctly or are confused.  Shortness of breath or difficulty breathing.  Dizziness and fainting.  You get a rash or develop hives.  You have a decrease in urine output.  Your urine turns a dark color or changes to pink, red, or brown. Any of the following symptoms occur over the next 10 days:  You have a temperature by mouth above 102 F (38.9 C), not controlled by medicine.  Shortness of breath.  Weakness after normal activity.  The white part of the eye turns yellow (jaundice).  You have a decrease in the amount of urine or are urinating less often.  Your urine turns a dark color or changes to pink, red, or brown. Document Released: 09/20/2000 Document Revised: 12/16/2011 Document Reviewed: 05/09/2008 [facility] Patient Information 2014 [place], [place].  _______________________________________________________________________

## 2015-03-27 ENCOUNTER — Encounter (HOSPITAL_COMMUNITY): Payer: Self-pay

## 2015-03-27 ENCOUNTER — Encounter (HOSPITAL_COMMUNITY)
Admission: RE | Admit: 2015-03-27 | Discharge: 2015-03-27 | Disposition: A | Discharge status: Home / Self Care | Payer: BLUE CROSS/BLUE SHIELD | Source: Ambulatory Visit | Attending: Urology | Admitting: Urology

## 2015-03-27 HISTORY — DX: Pain in unspecified knee: M25.569

## 2015-03-27 HISTORY — DX: Depression, unspecified: F32.A

## 2015-03-27 HISTORY — DX: Hesitancy of micturition: R39.11

## 2015-03-27 HISTORY — DX: Major depressive disorder, single episode, unspecified: F32.9

## 2015-03-27 HISTORY — DX: Malignant neoplasm of prostate: C61

## 2015-03-27 LAB — ABO/RH: ABO/RH(D): O POS

## 2015-03-27 LAB — BASIC METABOLIC PANEL
Anion gap: 8 (ref 5–15)
BUN: 21 mg/dL — ABNORMAL HIGH (ref 6–20)
CO2: 27 mmol/L (ref 22–32)
Calcium: 8.9 mg/dL (ref 8.9–10.3)
Chloride: 105 mmol/L (ref 101–111)
Creatinine, Ser: 0.91 mg/dL (ref 0.61–1.24)
GFR calc Af Amer: >60 mL/min (ref >60)
GFR calc non Af Amer: >60 mL/min (ref >60)
Glucose, Bld: 98 mg/dL (ref 65–99)
Potassium: 4.5 mmol/L (ref 3.5–5.1)
Sodium: 140 mmol/L (ref 135–145)

## 2015-03-27 LAB — CBC
HCT: 48 % (ref 39.0–52.0)
Hemoglobin: 15.9 g/dL (ref 13.0–17.0)
MCH: 31.5 pg (ref 26.0–34.0)
MCHC: 33.1 g/dL (ref 30.0–36.0)
MCV: 95.2 fL (ref 78.0–100.0)
Platelets: 195 10*3/uL (ref 150–400)
RBC: 5.04 MIL/uL (ref 4.22–5.81)
RDW: 13.1 % (ref 11.5–15.5)
WBC: 8 10*3/uL (ref 4.0–10.5)

## 2015-03-28 NOTE — H&P (Signed)
Reason For Visit f/u for discussion of recent CaP diagnosis   History of Present Illness 53WM referred by Dr. Tedra Senegal, MD for eval and management of an elevated PSA.  Patient was found to have an elevated PSA which was drawn as part of a prostate cancer screening. Due to the patient's knowledge this is his first PSA test. He has no family history of prostate cancer. The patient denies any bone pain, new back pain, or lower extremity edema. The patient denies any changes in his voiding symptoms over the last 6 months. Specifically he denies dysuria or hematuria. Patient has no history of prostatitis or urinary tract infections    PSA History:  9.47 on 08/16/14  8.71 (7% free) on 10/18/14    IPSS: 2, Q0L0  SHIM: 24    Prostate cancer profile  Stage: T1a  PSA: 8.71  Biopsy , 5/12 cores positive: RLM - 3+3=6 (40%), [place] x 2 3+3=6 (20%x2), LLB 3+3=6 (20%), LMM 3+3=6 (20%)  Prostate volume: 15gm    Prostate cancer nomogram after radical prostatectomy:  OC-   ECE-   SVI- 2  LNI -2  PFS (surgery)- 91/85%    Intv: has had some hematospermia, but otherwise recovered well from his biopsy   Past Medical History Problems  1. History of depression (Z86.59) 2. History of esophageal reflux (Z87.19) 3. History of hypercholesterolemia (Z86.39)  Surgical History Problems  1. History of Hernia Repair 2. History of Wrist Excision Of Ganglion  Current Meds 1. Ibuprofen 400 MG Oral Tablet; TAKE TABLET  PRN;  Therapy: (Recorded:12Jan2016) to Recorded 2. Levofloxacin 500 MG Oral Tablet; 1 tablet the day before the procedure, 1 tablet the day of  the procedure, and 1 tablet the day after the procedure;  Therapy: [id number] to (Last Rx:25Jan2016)  Requested for: 25Jan2016 Ordered 3. Simvastatin 10 MG Oral Tablet;  Therapy: (Recorded:12Jan2016) to Recorded 4. Wellbutrin XL 300 MG Oral Tablet Extended Release 24 Hour;  Therapy: (Recorded:12Jan2016) to  Recorded  Allergies Medication  1. Sulfa Drugs  Family History Problems  1. Family history of diabetes mellitus (Z83.3) : Mother, Father, Sister, Brother 2. Family history of heart failure (Z82.49) : Father 3. Family history of pancreatic cancer (Z80.0) : Mother  Social History Problems  1. Alcohol use (Z78.9)   0-2 2. Caffeine use (F15.90)   1-2 3. Father deceased 15. Former smoker [phone]) 5. Married 6. Mother deceased 81. Number of children   2 daughters 53. Occupation   marketing/sales  Assessment Assessed  1. Prostate cancer (C61)  T1c, low risk prostate cancer.   Plan Prostate cancer  1. Follow-up Week x 2 Office  Follow-up  Status: Hold For - Appointment,Date of Service   Requested for: 22Feb2016  Discussion/Summary PAtient will return in 2 months once life settles down for him to finalize details of treatment plan. Leaning towards surgery.   The patient was counseled about the natural history of prostate cancer and the standard treatment options that are available for prostate cancer. It was explained to him how his age and life expectancy, clinical stage, Gleason score, and PSA affect his prognosis, the decision to proceed with additional staging studies, as well as how that information influences recommended treatment strategies. We discussed the roles for active surveillance, radiation therapy, surgical therapy, androgen deprivation, as well as ablative therapy options for the treatment of prostate cancer as appropriate to his individual cancer situation. We discussed the risks and benefits of these options with regard to their impact on cancer control  and also in terms of potential adverse events, complications, and impact on quiality of life particularly related to urinary, bowel, and sexual function. The patient was encouraged to ask questions throughout the discussion today and all questions were answered to his stated satisfaction. In addition, the patient  was provided with and/or directed to appropriate resources and literature for further education about prostate cancer and treatment options.   We discussed surgical therapy for prostate cancer including the different available surgical approaches. We discussed, in detail, the risks and expectations of surgery with regard to cancer control, urinary control, and erectile function as well as the expected postoperative recovery process. The risks, potential complications/adverse events of radical prostatectomy as well as alternative options were explained to the patient.   We discussed surgical therapy for prostate cancer including the different available surgical approaches. We discussed, in detail, the risks and expectations of surgery with regard to cancer control, urinary control, and erectile function as well as the expected postoperative recovery process. Additional risks of surgery including but not limited to bleeding, infection, hernia formation, nerve damage, lymphocele formation, bowel/rectal injury potentially necessitating colostomy, damage to the urinary tract resulting in urine leakage, urethral stricture, and the cardiopulmonary risks such as myocardial infarction, stroke, death, venothromboembolism, etc. were explained. The risk of open surgical conversion for robotic/laparoscopic prostatectomy was also discussed.

## 2015-03-29 ENCOUNTER — Inpatient Hospital Stay (HOSPITAL_COMMUNITY): Payer: BLUE CROSS/BLUE SHIELD | Admitting: Certified Registered Nurse Anesthetist

## 2015-03-29 ENCOUNTER — Encounter (HOSPITAL_COMMUNITY): Payer: Self-pay | Admitting: *Deleted

## 2015-03-29 ENCOUNTER — Encounter (HOSPITAL_COMMUNITY)
Admission: RE | Disposition: A | Discharge status: Home / Self Care | Payer: Self-pay | Source: Ambulatory Visit | Attending: Urology

## 2015-03-29 ENCOUNTER — Inpatient Hospital Stay (HOSPITAL_COMMUNITY)
Admission: RE | Admit: 2015-03-29 | Discharge: 2015-03-30 | DRG: 708 | Disposition: A | Discharge status: Home / Self Care | Payer: BLUE CROSS/BLUE SHIELD | Source: Ambulatory Visit | Attending: Urology | Admitting: Urology

## 2015-03-29 DIAGNOSIS — Z882 Allergy status to sulfonamides status: Secondary | ICD-10-CM

## 2015-03-29 DIAGNOSIS — Q644 Malformation of urachus: Secondary | ICD-10-CM

## 2015-03-29 DIAGNOSIS — C61 Malignant neoplasm of prostate: Secondary | ICD-10-CM | POA: Diagnosis present

## 2015-03-29 DIAGNOSIS — Z87891 Personal history of nicotine dependence: Secondary | ICD-10-CM

## 2015-03-29 DIAGNOSIS — E780 Pure hypercholesterolemia: Secondary | ICD-10-CM | POA: Diagnosis present

## 2015-03-29 DIAGNOSIS — E785 Hyperlipidemia, unspecified: Secondary | ICD-10-CM | POA: Diagnosis present

## 2015-03-29 DIAGNOSIS — F329 Major depressive disorder, single episode, unspecified: Secondary | ICD-10-CM | POA: Diagnosis present

## 2015-03-29 DIAGNOSIS — K219 Gastro-esophageal reflux disease without esophagitis: Secondary | ICD-10-CM | POA: Diagnosis present

## 2015-03-29 HISTORY — PX: ROBOT ASSISTED LAPAROSCOPIC RADICAL PROSTATECTOMY: SHX5141

## 2015-03-29 LAB — TYPE AND SCREEN
ABO/RH(D): O POS
Antibody Screen: NEGATIVE

## 2015-03-29 LAB — HEMOGLOBIN AND HEMATOCRIT, BLOOD
HCT: 37.1 % — ABNORMAL LOW (ref 39.0–52.0)
Hemoglobin: 12.2 g/dL — ABNORMAL LOW (ref 13.0–17.0)

## 2015-03-29 LAB — URINE CULTURE: Culture: NO GROWTH

## 2015-03-29 SURGERY — ROBOTIC ASSISTED LAPAROSCOPIC RADICAL PROSTATECTOMY
Anesthesia: General | Laterality: Bilateral

## 2015-03-29 MED ORDER — PROMETHAZINE HCL 25 MG/ML IJ SOLN: 6.2500 mg | INTRAMUSCULAR | Status: DC | PRN

## 2015-03-29 MED ORDER — OXYCODONE HCL 5 MG PO TABS
5.0000 mg | ORAL_TABLET | ORAL | Status: DC | PRN
  Administered 2015-03-29 – 2015-03-30 (×2): 5 mg via ORAL
  Filled 2015-03-29 (×2): qty 1

## 2015-03-29 MED ORDER — FENTANYL CITRATE (PF) 100 MCG/2ML IJ SOLN
INTRAMUSCULAR | Status: DC | PRN
  Administered 2015-03-29 (×5): 50 ug via INTRAVENOUS

## 2015-03-29 MED ORDER — CIPROFLOXACIN HCL 500 MG PO TABS: 500.0000 mg | ORAL_TABLET | Freq: Two times a day (BID) | ORAL | Status: DC

## 2015-03-29 MED ORDER — HYDROCODONE-ACETAMINOPHEN 10-325 MG PO TABS: 1.0000 | ORAL_TABLET | Freq: Four times a day (QID) | ORAL | Status: DC | PRN

## 2015-03-29 MED ORDER — PROPOFOL 10 MG/ML IV BOLUS
INTRAVENOUS | Status: AC
  Filled 2015-03-29: qty 20

## 2015-03-29 MED ORDER — ROCURONIUM BROMIDE 100 MG/10ML IV SOLN
INTRAVENOUS | Status: DC | PRN
  Administered 2015-03-29 (×5): 10 mg via INTRAVENOUS
  Administered 2015-03-29: 50 mg via INTRAVENOUS

## 2015-03-29 MED ORDER — LIDOCAINE HCL (CARDIAC) 20 MG/ML IV SOLN
INTRAVENOUS | Status: DC | PRN
  Administered 2015-03-29: 100 mg via INTRAVENOUS

## 2015-03-29 MED ORDER — PROPOFOL 10 MG/ML IV BOLUS
INTRAVENOUS | Status: DC | PRN
  Administered 2015-03-29: 150 mg via INTRAVENOUS

## 2015-03-29 MED ORDER — CIPROFLOXACIN IN D5W 400 MG/200ML IV SOLN
INTRAVENOUS | Status: AC
  Filled 2015-03-29: qty 200

## 2015-03-29 MED ORDER — CIPROFLOXACIN IN D5W 400 MG/200ML IV SOLN
400.0000 mg | INTRAVENOUS | Status: AC
  Administered 2015-03-29: 400 mg via INTRAVENOUS

## 2015-03-29 MED ORDER — MORPHINE SULFATE 2 MG/ML IJ SOLN
2.0000 mg | INTRAMUSCULAR | Status: DC | PRN
  Administered 2015-03-29 – 2015-03-30 (×4): 2 mg via INTRAVENOUS
  Filled 2015-03-29 (×4): qty 1

## 2015-03-29 MED ORDER — SODIUM CHLORIDE 0.9 % IJ SOLN
INTRAMUSCULAR | Status: AC
  Filled 2015-03-29: qty 10

## 2015-03-29 MED ORDER — ACETAMINOPHEN 10 MG/ML IV SOLN
1000.0000 mg | Freq: Once | INTRAVENOUS | Status: AC
  Administered 2015-03-29: 1000 mg via INTRAVENOUS

## 2015-03-29 MED ORDER — ARTIFICIAL TEARS OP OINT
TOPICAL_OINTMENT | OPHTHALMIC | Status: AC
  Filled 2015-03-29: qty 7

## 2015-03-29 MED ORDER — BUPROPION HCL ER (XL) 300 MG PO TB24
300.0000 mg | ORAL_TABLET | Freq: Every day | ORAL | Status: DC
  Administered 2015-03-30: 300 mg via ORAL
  Filled 2015-03-29 (×2): qty 1

## 2015-03-29 MED ORDER — DEXAMETHASONE SODIUM PHOSPHATE 10 MG/ML IJ SOLN
INTRAMUSCULAR | Status: DC | PRN
  Administered 2015-03-29: 10 mg via INTRAVENOUS

## 2015-03-29 MED ORDER — BUPIVACAINE LIPOSOME 1.3 % IJ SUSP
20.0000 mL | Freq: Once | INTRAMUSCULAR | Status: AC
  Administered 2015-03-29: 20 mL
  Filled 2015-03-29: qty 20

## 2015-03-29 MED ORDER — LACTATED RINGERS IV SOLN
INTRAVENOUS | Status: DC | PRN
  Administered 2015-03-29 (×3): via INTRAVENOUS

## 2015-03-29 MED ORDER — PHENYLEPHRINE HCL 10 MG/ML IJ SOLN
INTRAMUSCULAR | Status: DC | PRN
  Administered 2015-03-29 (×2): 40 ug via INTRAVENOUS

## 2015-03-29 MED ORDER — CEFAZOLIN SODIUM-DEXTROSE 2-3 GM-% IV SOLR
INTRAVENOUS | Status: AC
  Filled 2015-03-29: qty 50

## 2015-03-29 MED ORDER — NEOSTIGMINE METHYLSULFATE 10 MG/10ML IV SOLN
INTRAVENOUS | Status: AC
  Filled 2015-03-29: qty 1

## 2015-03-29 MED ORDER — HYDROMORPHONE HCL 1 MG/ML IJ SOLN
INTRAMUSCULAR | Status: DC | PRN
  Administered 2015-03-29: 0.5 mg via INTRAVENOUS
  Administered 2015-03-29: 1 mg via INTRAVENOUS
  Administered 2015-03-29: 0.5 mg via INTRAVENOUS

## 2015-03-29 MED ORDER — NEOSTIGMINE METHYLSULFATE 10 MG/10ML IV SOLN
INTRAVENOUS | Status: DC | PRN
  Administered 2015-03-29: 4 mg via INTRAVENOUS

## 2015-03-29 MED ORDER — DEXTROSE-NACL 5-0.45 % IV SOLN
INTRAVENOUS | Status: DC
  Administered 2015-03-29 – 2015-03-30 (×3): via INTRAVENOUS

## 2015-03-29 MED ORDER — KETOROLAC TROMETHAMINE 30 MG/ML IJ SOLN
INTRAMUSCULAR | Status: AC
  Filled 2015-03-29: qty 1

## 2015-03-29 MED ORDER — CEFAZOLIN SODIUM-DEXTROSE 2-3 GM-% IV SOLR
2.0000 g | INTRAVENOUS | Status: AC
  Administered 2015-03-29: 2 g via INTRAVENOUS

## 2015-03-29 MED ORDER — STERILE WATER FOR IRRIGATION IR SOLN
Status: DC | PRN
  Administered 2015-03-29: 3000 mL

## 2015-03-29 MED ORDER — BUPIVACAINE-EPINEPHRINE 0.25% -1:200000 IJ SOLN
INTRAMUSCULAR | Status: DC | PRN
  Administered 2015-03-29: 10 mL

## 2015-03-29 MED ORDER — ONDANSETRON HCL 4 MG/2ML IJ SOLN
INTRAMUSCULAR | Status: DC | PRN
  Administered 2015-03-29: 4 mg via INTRAVENOUS

## 2015-03-29 MED ORDER — HYDROMORPHONE HCL 2 MG/ML IJ SOLN
INTRAMUSCULAR | Status: AC
  Filled 2015-03-29: qty 1

## 2015-03-29 MED ORDER — DEXAMETHASONE SODIUM PHOSPHATE 10 MG/ML IJ SOLN
INTRAMUSCULAR | Status: AC
  Filled 2015-03-29: qty 1

## 2015-03-29 MED ORDER — MIDAZOLAM HCL 2 MG/2ML IJ SOLN
INTRAMUSCULAR | Status: AC
  Filled 2015-03-29: qty 2

## 2015-03-29 MED ORDER — LACTATED RINGERS IR SOLN
Status: DC | PRN
  Administered 2015-03-29: 1

## 2015-03-29 MED ORDER — MIDAZOLAM HCL 5 MG/5ML IJ SOLN
INTRAMUSCULAR | Status: DC | PRN
  Administered 2015-03-29: 2 mg via INTRAVENOUS

## 2015-03-29 MED ORDER — LACTATED RINGERS IV SOLN: INTRAVENOUS | Status: DC

## 2015-03-29 MED ORDER — HYDROMORPHONE HCL 1 MG/ML IJ SOLN
0.2500 mg | INTRAMUSCULAR | Status: DC | PRN
  Administered 2015-03-29 (×2): 0.5 mg via INTRAVENOUS

## 2015-03-29 MED ORDER — KETOROLAC TROMETHAMINE 30 MG/ML IJ SOLN
30.0000 mg | Freq: Four times a day (QID) | INTRAMUSCULAR | Status: DC
  Administered 2015-03-29 – 2015-03-30 (×3): 30 mg via INTRAVENOUS
  Filled 2015-03-29 (×5): qty 1

## 2015-03-29 MED ORDER — LIDOCAINE HCL (CARDIAC) 20 MG/ML IV SOLN
INTRAVENOUS | Status: AC
  Filled 2015-03-29: qty 5

## 2015-03-29 MED ORDER — FENTANYL CITRATE (PF) 250 MCG/5ML IJ SOLN
INTRAMUSCULAR | Status: AC
  Filled 2015-03-29: qty 5

## 2015-03-29 MED ORDER — SIMVASTATIN 10 MG PO TABS
10.0000 mg | ORAL_TABLET | Freq: Every day | ORAL | Status: DC
  Administered 2015-03-29: 10 mg via ORAL
  Filled 2015-03-29: qty 1

## 2015-03-29 MED ORDER — CLONAZEPAM 0.5 MG PO TABS
0.5000 mg | ORAL_TABLET | Freq: Two times a day (BID) | ORAL | Status: DC | PRN
Start: 2015-03-29 — End: 2015-03-30
  Administered 2015-03-29: 0.5 mg via ORAL
  Filled 2015-03-29: qty 1

## 2015-03-29 MED ORDER — SUCCINYLCHOLINE CHLORIDE 20 MG/ML IJ SOLN
INTRAMUSCULAR | Status: DC | PRN
  Administered 2015-03-29: 100 mg via INTRAVENOUS

## 2015-03-29 MED ORDER — GLYCOPYRROLATE 0.2 MG/ML IJ SOLN
INTRAMUSCULAR | Status: AC
  Filled 2015-03-29: qty 3

## 2015-03-29 MED ORDER — ACETAMINOPHEN 10 MG/ML IV SOLN
INTRAVENOUS | Status: AC
  Filled 2015-03-29: qty 100

## 2015-03-29 MED ORDER — GLYCOPYRROLATE 0.2 MG/ML IJ SOLN
INTRAMUSCULAR | Status: DC | PRN
  Administered 2015-03-29: 0.6 mg via INTRAVENOUS

## 2015-03-29 MED ORDER — KETOROLAC TROMETHAMINE 30 MG/ML IJ SOLN
INTRAMUSCULAR | Status: DC | PRN
  Administered 2015-03-29: 30 mg via INTRAVENOUS

## 2015-03-29 MED ORDER — ESMOLOL HCL 10 MG/ML IV SOLN
INTRAVENOUS | Status: AC
  Filled 2015-03-29: qty 10

## 2015-03-29 MED ORDER — ESMOLOL HCL 10 MG/ML IV SOLN
INTRAVENOUS | Status: DC | PRN
  Administered 2015-03-29 (×2): 10 mg via INTRAVENOUS

## 2015-03-29 MED ORDER — BUPIVACAINE-EPINEPHRINE (PF) 0.25% -1:200000 IJ SOLN
INTRAMUSCULAR | Status: AC
  Filled 2015-03-29: qty 30

## 2015-03-29 MED ORDER — ACETAMINOPHEN 325 MG PO TABS: 650.0000 mg | ORAL_TABLET | ORAL | Status: DC | PRN

## 2015-03-29 MED ORDER — ONDANSETRON HCL 4 MG/2ML IJ SOLN
INTRAMUSCULAR | Status: AC
  Filled 2015-03-29: qty 2

## 2015-03-29 MED ORDER — EPHEDRINE SULFATE 50 MG/ML IJ SOLN
INTRAMUSCULAR | Status: AC
  Filled 2015-03-29: qty 1

## 2015-03-29 MED ORDER — ROCURONIUM BROMIDE 100 MG/10ML IV SOLN
INTRAVENOUS | Status: AC
  Filled 2015-03-29: qty 1

## 2015-03-29 MED ORDER — SODIUM CHLORIDE 0.9 % IV BOLUS (SEPSIS)
1000.0000 mL | Freq: Once | INTRAVENOUS | Status: AC
  Administered 2015-03-29: 1000 mL via INTRAVENOUS

## 2015-03-29 MED ORDER — HYDROMORPHONE HCL 1 MG/ML IJ SOLN
INTRAMUSCULAR | Status: AC
  Filled 2015-03-29: qty 1

## 2015-03-29 SURGICAL SUPPLY — 49 items
CABLE HIGH FREQUENCY MONO STRZ (ELECTRODE) ×2 IMPLANT
CATH FOLEY 2WAY SLVR 18FR 30CC (CATHETERS) ×2 IMPLANT
CATH ROBINSON RED A/P 16FR (CATHETERS) ×2 IMPLANT
CATH TIEMANN FOLEY 18FR 5CC (CATHETERS) ×2 IMPLANT
CHLORAPREP W/TINT 26ML (MISCELLANEOUS) ×2 IMPLANT
CLIP LIGATING HEM O LOK PURPLE (MISCELLANEOUS) ×14 IMPLANT
CLOTH BEACON ORANGE TIMEOUT ST (SAFETY) ×2 IMPLANT
COVER SURGICAL LIGHT HANDLE (MISCELLANEOUS) ×2 IMPLANT
COVER TIP SHEARS 8 DVNC (MISCELLANEOUS) ×1 IMPLANT
COVER TIP SHEARS 8MM DA VINCI (MISCELLANEOUS) ×1
DECANTER SPIKE VIAL GLASS SM (MISCELLANEOUS) IMPLANT
DRAPE CAMERA CLOSED 9X96 (DRAPES) ×2 IMPLANT
DRAPE SURG IRRIG POUCH 19X23 (DRAPES) ×2 IMPLANT
DRSG TEGADERM 2-3/8X2-3/4 SM (GAUZE/BANDAGES/DRESSINGS) ×8 IMPLANT
DRSG TEGADERM 4X4.75 (GAUZE/BANDAGES/DRESSINGS) ×4 IMPLANT
DRSG TEGADERM 6X8 (GAUZE/BANDAGES/DRESSINGS) ×4 IMPLANT
ELECT REM PT RETURN 9FT ADLT (ELECTROSURGICAL) ×2
ELECTRODE REM PT RTRN 9FT ADLT (ELECTROSURGICAL) ×1 IMPLANT
GAUZE SPONGE 2X2 8PLY STRL LF (GAUZE/BANDAGES/DRESSINGS) ×1 IMPLANT
GLOVE BIO SURGEON STRL SZ 6.5 (GLOVE) ×2 IMPLANT
GLOVE BIOGEL M STRL SZ7.5 (GLOVE) ×6 IMPLANT
GOWN STRL REUS W/TWL LRG LVL3 (GOWN DISPOSABLE) ×4 IMPLANT
GOWN STRL REUS W/TWL XL LVL3 (GOWN DISPOSABLE) ×4 IMPLANT
HEMOSTAT SURGICEL 4X8 (HEMOSTASIS) ×2 IMPLANT
HOLDER FOLEY CATH W/STRAP (MISCELLANEOUS) ×2 IMPLANT
IV LACTATED RINGERS 1000ML (IV SOLUTION) ×2 IMPLANT
KIT ACCESSORY DA VINCI DISP (KITS) ×1
KIT ACCESSORY DVNC DISP (KITS) ×1 IMPLANT
KIT PROCEDURE DA VINCI SI (MISCELLANEOUS) ×1
KIT PROCEDURE DVNC SI (MISCELLANEOUS) ×1 IMPLANT
LIQUID BAND (GAUZE/BANDAGES/DRESSINGS) ×2 IMPLANT
NEEDLE INSUFFLATION 14GA 120MM (NEEDLE) ×2 IMPLANT
PACK ROBOT UROLOGY CUSTOM (CUSTOM PROCEDURE TRAY) ×2 IMPLANT
PAD POSITIONING PINK XL (MISCELLANEOUS) ×2 IMPLANT
RELOAD WH ECHELON 45 (STAPLE) ×2 IMPLANT
SET TUBE IRRIG SUCTION NO TIP (IRRIGATION / IRRIGATOR) ×2 IMPLANT
SHEET LAVH (DRAPES) ×2 IMPLANT
SOLUTION ELECTROLUBE (MISCELLANEOUS) ×2 IMPLANT
SPONGE GAUZE 2X2 STER 10/PKG (GAUZE/BANDAGES/DRESSINGS) ×1
SUT ETHILON 3 0 PS 1 (SUTURE) ×2 IMPLANT
SUT MNCRL AB 4-0 PS2 18 (SUTURE) ×4 IMPLANT
SUT VIC AB 0 CT1 27 (SUTURE) ×2
SUT VIC AB 0 CT1 27XBRD ANTBC (SUTURE) ×2 IMPLANT
SUT VICRYL 0 UR6 27IN ABS (SUTURE) ×4 IMPLANT
SUT VLOC BARB 180 ABS3/0GR12 (SUTURE) ×6
SUTURE VLOC BRB 180 ABS3/0GR12 (SUTURE) ×3 IMPLANT
TOWEL OR NON WOVEN STRL DISP B (DISPOSABLE) ×4 IMPLANT
TROCAR 12M 150ML BLUNT (TROCAR) ×2 IMPLANT
WATER STERILE IRR 1500ML POUR (IV SOLUTION) ×4 IMPLANT

## 2015-03-29 NOTE — Transfer of Care (Signed)
Immediate Anesthesia Transfer of Care Note  Patient: Patrick Sanchez  Procedure(s) Performed: Procedure(s): ROBOTIC ASSISTED LAPAROSCOPIC RADICAL PROSTATECTOMY/BILATERAL PELVIC LYMPH NODE DISSECTION (Bilateral)  Patient Location: PACU  Anesthesia Type:General  Level of Consciousness:  sedated, patient cooperative and responds to stimulation  Airway & Oxygen Therapy:Patient Spontanous Breathing and Patient connected to face mask oxgen  Post-op Assessment:  Report given to PACU RN and Post -op Vital signs reviewed and stable  Post vital signs:  Reviewed and stable  Last Vitals:  Filed Vitals:   03/29/15 1158  BP: 146/75  Pulse: 115  Temp:   Resp: 17    Complications: No apparent anesthesia complications

## 2015-03-29 NOTE — Interval H&P Note (Signed)
History and Physical Interval Note: After careful consideration, the patient has opted for robotic-assisted laparoscopic prostatectomy.  I reviewed this procedure with the patient approximately one month ago and he is anxious to proceed.  There are no changes to his history and physical. RRR CTA-B Proceed with surgery as planned 03/29/2015 7:23 AM  Patrick Sanchez  has presented today for surgery, with the diagnosis of prostate cancer  The various methods of treatment have been discussed with the patient and family. After consideration of risks, benefits and other options for treatment, the patient has consented to  Procedure(s): ROBOTIC ASSISTED LAPAROSCOPIC RADICAL PROSTATECTOMY/BILATERAL PELVIC LYMPH NODE DISSECTION (Bilateral) as a surgical intervention .  The patient's history has been reviewed, patient examined, no change in status, stable for surgery.  I have reviewed the patient's chart and labs.  Questions were answered to the patient's satisfaction.     Louis Meckel W

## 2015-03-29 NOTE — Anesthesia Procedure Notes (Addendum)
Procedure Name: Intubation Date/Time: 03/29/2015 7:41 AM Performed by: Montel Clock Pre-anesthesia Checklist: Patient identified, Emergency Drugs available, Suction available, Patient being monitored and Timeout performed Patient Re-evaluated:Patient Re-evaluated prior to inductionOxygen Delivery Method: Circle system utilized Preoxygenation: Pre-oxygenation with 100% oxygen Intubation Type: IV induction Ventilation: Mask ventilation without difficulty and Oral airway inserted - appropriate to patient size Laryngoscope Size: Mac and 3 Grade View: Grade I Tube type: Oral Tube size: 7.5 mm Number of attempts: 1 Airway Equipment and Method: Stylet Placement Confirmation: ETT inserted through vocal cords under direct vision,  positive ETCO2 and breath sounds checked- equal and bilateral Secured at: 23 cm Tube secured with: Tape Dental Injury: Teeth and Oropharynx as per pre-operative assessment  Comments: Slightly limited oral opening, no difficulty intubating with Grade 1 view.

## 2015-03-29 NOTE — Discharge Instructions (Signed)

## 2015-03-29 NOTE — Anesthesia Preprocedure Evaluation (Signed)
Anesthesia Evaluation  Patient identified by MRN, date of birth, ID band Patient awake    Reviewed: Allergy & Precautions, NPO status , Patient's Chart, lab work & pertinent test results  Airway Mallampati: II  TM Distance: >3 FB Neck ROM: Full    Dental   Pulmonary former smoker,  breath sounds clear to auscultation        Cardiovascular negative cardio ROS  Rhythm:Regular Rate:Normal     Neuro/Psych    GI/Hepatic Neg liver ROS, GERD-  ,  Endo/Other  negative endocrine ROS  Renal/GU negative Renal ROS     Musculoskeletal   Abdominal   Peds  Hematology   Anesthesia Other Findings   Reproductive/Obstetrics                             Anesthesia Physical Anesthesia Plan  ASA: II  Anesthesia Plan: General   Post-op Pain Management:    Induction: Intravenous  Airway Management Planned: Oral ETT  Additional Equipment:   Intra-op Plan:   Post-operative Plan: Extubation in OR  Informed Consent: I have reviewed the patients History and Physical, chart, labs and discussed the procedure including the risks, benefits and alternatives for the proposed anesthesia with the patient or authorized representative who has indicated his/her understanding and acceptance.   Dental advisory given  Plan Discussed with: CRNA and Anesthesiologist  Anesthesia Plan Comments:         Anesthesia Quick Evaluation

## 2015-03-29 NOTE — Anesthesia Postprocedure Evaluation (Signed)
  Anesthesia Post-op Note  Patient: Patrick Sanchez  Procedure(s) Performed: Procedure(s): ROBOTIC ASSISTED LAPAROSCOPIC RADICAL PROSTATECTOMY/BILATERAL PELVIC LYMPH NODE DISSECTION (Bilateral)  Patient Location: PACU  Anesthesia Type:General  Level of Consciousness: alert   Airway and Oxygen Therapy: Patient Spontanous Breathing  Post-op Pain: mild  Post-op Assessment: Post-op Vital signs reviewed              Post-op Vital Signs: Reviewed  Last Vitals:  Filed Vitals:   03/29/15 1309  BP: 143/84  Pulse: 112  Temp: 36.7 C  Resp:     Complications: No apparent anesthesia complications

## 2015-03-29 NOTE — Op Note (Signed)
Preoperative diagnosis:  1. Prostate Cancer   Postoperative diagnosis:  1. same   Procedure: 1. Robotic assisted laparoscopic radical prostatectomy 2. Bilateral pelvic lymph node dissection  Surgeon: Ardis Hughs, MD  First Assistant: Debbrah Alar, PA  Anesthesia: General  Complications: None  Intraoperative findings: bilateral nerve sparing prostatectomy. Frozen sections sent from right/left lateral base, left medial base and left apex.  These were negative for malignancy.  The base biopsies did have some prostatic tissue.  I did not see any additional prostate tissue within the resection bed after cutting out the small pieces for frozen specimens.  EBL: 300cc  Specimens:  #1.  Prostate and seminal vesicals #2.  Bilateral pelvic lymph nodes  Indication: Patrick Sanchez is a 54 y.o. patient with prostate cancer.  After reviewing the management options for treatment, he elected to proceed with the removal of his prostate. We have discussed the potential benefits and risks of the procedure, side effects of the proposed treatment, the likelihood of the patient achieving the goals of the procedure, and any potential problems that might occur during the procedure or recuperation. Informed consent has been obtained.  Description of procedure:  The patient was consented in the preoperative holding area. He is in brought back to the operating room placed the table in supine position. General anesthesia was then induced and endotracheal tube was inserted. He was then placed in dorsolithotomy position and placed in steep Trendelenburg. He was then prepped and draped in the routine sterile fashion. We then began by making a 12 mm incision supraumbilical midline incision the skin down through into the peritoneum. Then placed a 12 mm trocar. Inflated the abdomen and inserted the 0 robotic lens. We then placed 2 additional a 8 millimeter trochars in the patient's left lower abdomen proximally  9 cm apart and 2 trochars on the patient's right lower abdomen, one was in 8 mm trocar and the one most lateral was a 12 mm trocar which was used as the assistant port. A 5 mm trocar was placed by triangulating the 2 right lateral ports as a second assistant port. These ports were all placed under visual guidance. Once the ports were noted to be satisfactory position the robot was docked. We started with the 0 lens, monopolar scissors and the right hand and the PK forceps the left hand as well as a fenestrated grasper as the third arm on the left-hand side.   We began our dissection the posterior plane incising the peritoneum at the level of the vas deferens. Isolated the left vas deferens and dissected it proximally towards the spermatic cord for 5 cm prior to ligating it. Then used this as traction to isolate the left the seminal vesicle which was then undressed bluntly, all vessels were cauterized with a combination of bipolar and the monopolar scissors. Once this had been dissected out laterally and posteriorly turned our attention to the anterior plane and freed the the left seminal vesicle from the surrounding tissues. We then turned our attention to the right side and similarly dissected out the right vas deferens in the right seminal vesicle. Once the SCDs had been freed turned our attention to the posterior plane and bluntly dissected the tissue between the rectum and the posterior wall of the prostate bluntly out towards the apex.    This point the bladder was taken down starting at the urachal remnant with a combination of both blunt dissection and sharp dissection with monopolar cautery the bladder was dropped  down in the usual fashion to the medial umbilical ligaments laterally and the dorsal vein of the prostate anteriorly creating our space of Retzius. We then turned our attention to the endopelvic fascia which was incised laterally starting on the patient's right-hand side the levator muscles  were pushed off the prostate laterally up towards the dorsal vein complex on the right-hand side. This process was then repeated on the left-hand side and a nice notch was created for the dorsal vein. I then used a 42mm stapler to staple the dorsal vein.   We then located the bladder neck at the vesicoprostatic junction and the monopolar scissors dissected down through the perivesical tissues and the bladder neck down to the prostatic urethra. The catheter was then deflated and pulled through our urethral opening and then used to retract the prostate anteriorly for the posterior bladder neck dissection. Once through the bladder neck and into the posterior plane of the prostate the SVs were brought through the opening. The left pedicle was then isolated and systematically ligated with Weck clips and scissors. The nerve bundle was then peeled off the posterior lateral aspect of the prostate and bluntly dissected away off the prostate. This was then repeated on the right side.  I then came down through the dorsal venous complex anteriorly down to the membranous urethra using the monopolar. Once down to the urethra the urethra was transected sharply and the apex of the prostate was then dissected off the levator and rectourethralis muscles. Once the apex of the prostate had been dissected free we came back to the base of the prostate and bluntly push the rectum and nerve vascular bundle off the prostate the patient's left and used clips on the patient's right to free the prostate. Once the prostate was free was placed in the Endo Catch bag and the string brought to the 5 mm port. The pelvis was then irrigated with normal saline and noted to be relatively hemostatic.   Using the 3-0 v lock suture a Rocco stitch was performed pulling the bladder neck down to the urethral stump. The vesicourethral anastomosis was then completed with 2 interlocking 3-0 V. lock sutures running the anastomosis in the 6:00 position to  the 12:00 position on each side and then tying it off on the top. The final catheter was then passed through the patient's urethra and into the bladder and 120 cc was instilled into the bladder to test the anastomosis. As there was no leak a 55 [place] Blake drain was passed through the left lateral port and placed around the vesicourethral anastomosis. A 12 mm assistant port on the right lateral side was then closed with 0 Vicryl with the help of the [employer] needle. The 12 mm midline infraumbilical incision was then extended another centimeter taken down and the fascia opened to remove the Endo Catch bag with the prostate specimen. The fascia was then closed with a 0 Vicryl and all skin ports were closed with 4-0 Monocryl in a subcutaneous fashion. Dermabond glue was then applied to the incisions. The drain was then secured to the skin with a 0 nylon stitch and dressing applied.   At the end of the case all laps needles and sponges had been accounted for. There no immediate complications. The patient returned to the PACU in stable condition.

## 2015-03-30 ENCOUNTER — Other Ambulatory Visit: Payer: Self-pay | Admitting: Internal Medicine

## 2015-03-30 ENCOUNTER — Encounter (HOSPITAL_COMMUNITY): Payer: Self-pay | Admitting: Urology

## 2015-03-30 LAB — HEMOGLOBIN AND HEMATOCRIT, BLOOD
HCT: 33.6 % — ABNORMAL LOW (ref 39.0–52.0)
HCT: 34.2 % — ABNORMAL LOW (ref 39.0–52.0)
Hemoglobin: 10.9 g/dL — ABNORMAL LOW (ref 13.0–17.0)
Hemoglobin: 11.2 g/dL — ABNORMAL LOW (ref 13.0–17.0)

## 2015-03-30 LAB — BASIC METABOLIC PANEL
Anion gap: 4 — ABNORMAL LOW (ref 5–15)
BUN: 11 mg/dL (ref 6–20)
CO2: 27 mmol/L (ref 22–32)
Calcium: 7.5 mg/dL — ABNORMAL LOW (ref 8.9–10.3)
Chloride: 105 mmol/L (ref 101–111)
Creatinine, Ser: 0.92 mg/dL (ref 0.61–1.24)
GFR calc Af Amer: >60 mL/min (ref >60)
GFR calc non Af Amer: >60 mL/min (ref >60)
Glucose, Bld: 149 mg/dL — ABNORMAL HIGH (ref 65–99)
Potassium: 3.6 mmol/L (ref 3.5–5.1)
Sodium: 136 mmol/L (ref 135–145)

## 2015-03-30 MED ORDER — DOCUSATE SODIUM 100 MG PO CAPS: 100.0000 mg | ORAL_CAPSULE | Freq: Two times a day (BID) | ORAL | Status: DC | PRN

## 2015-03-30 MED ORDER — BISACODYL 10 MG RE SUPP
10.0000 mg | Freq: Once | RECTAL | Status: AC
  Administered 2015-03-30: 10 mg via RECTAL
  Filled 2015-03-30: qty 1

## 2015-03-30 MED ORDER — OXYCODONE HCL 5 MG PO TABS: 5.0000 mg | ORAL_TABLET | ORAL | Status: DC | PRN

## 2015-03-30 NOTE — Progress Notes (Signed)
1 Day Post-Op Subjective: The patient is doing well.  No nausea or vomiting. Pain is adequately controlled.  Objective: Vital signs in last 24 hours: Temp:  [97.8 F (36.6 C)-98.7 F (37.1 C)] 98 F (36.7 C) (06/23 0937) Pulse Rate:  [86-115] 94 (06/23 0937) Resp:  [9-20] 18 (06/23 0937) BP: (106-146)/(62-84) 106/65 mmHg (06/23 0937) SpO2:  [93 %-100 %] 99 % (06/23 0937)  Intake/Output from previous day: 06/22 0701 - 06/23 0700 In: 6560.8 [P.O.:240; I.V.:5320.8; IV Piggyback:1000] Out: 5956 [Urine:3130; Drains:65; Blood:300] Intake/Output this shift: Total I/O In: 240 [P.O.:240] Out: 1300 [Urine:1300]  Physical Exam:  General: Alert and oriented. GI: Soft, Nondistended. Incisions: Dressings intact. Urine: Clear Extremities: Nontender, no erythema, no edema.  Lab Results:  Recent Labs  03/27/15 1345 03/29/15 1239 03/30/15 0503  WBC 8.0  --   --   HGB 15.9 12.2* 10.9*  HCT 48.0 37.1* 33.6*    Recent Labs  03/27/15 1345 03/30/15 0503  NA 140 136  K 4.5 3.6  CL 105 105  CO2 27 27  GLUCOSE 98 149*  BUN 21* 11  CREATININE 0.91 0.92  CALCIUM 8.9 7.5*      Assessment/Plan: POD# 1 s/p robotic prostatectomy.  1) SL IVF 2) Ambulate, Incentive spirometry 3) Transition to oral pain medication 4) Dulcolax suppository 5) D/C pelvic drain 6) Plan for likely discharge later today   LOS: 1 day   Louis Meckel W 03/30/2015, 11:44 AM

## 2015-03-30 NOTE — Care Management Note (Signed)
Case Management Note  Patient Details  Name: Patrick Sanchez MRN: [id number] Date of Birth: [birth year]  Subjective/Objective: 54 y/o m admitted w/prostat ca, s/p prostatectomy.                   Action/Plan:d/c home no needs.   Expected Discharge Date:                  Expected Discharge Plan:  Home/Self Care  In-House Referral:     Discharge planning Services  CM Consult  Post Acute Care Choice:    Choice offered to:     DME Arranged:    DME Agency:     HH Arranged:    [place] Agency:     Status of Service:  Completed, signed off  Medicare Important Message Given:    Date Medicare IM Given:    Medicare IM give by:    Date Additional Medicare IM Given:    Additional Medicare Important Message give by:     If discussed at [place] of Stay Meetings, dates discussed:    Additional Comments:  Dessa Phi, RN 03/30/2015, 12:24 PM

## 2015-03-30 NOTE — Telephone Encounter (Signed)
Refill once 

## 2015-03-30 NOTE — Discharge Summary (Signed)
Date of admission: 03/29/2015  Date of discharge: 03/30/2015  Admission diagnosis: Prostate Cancer  Discharge diagnosis: same, s/p robotic assisted laparoscopic prostatectomy  Secondary diagnoses:  Patient Active Problem List   Diagnosis Date Noted  . Prostate cancer 03/29/2015  . Allergic rhinitis 04/30/2011  . Depression 04/30/2011  . GE reflux 04/30/2011  . Benign essential tremor 04/30/2011  . Hyperlipidemia 04/30/2011    History and Physical: For full details, please see admission history and physical. Briefly, Patrick Sanchez is a 54 y.o. year old patient with prostate cancer.   Hospital Course: Patient tolerated the procedure well.  He was then transferred to the floor after an uneventful PACU stay.  His hospital course was uncomplicated.  On POD#1  he had met discharge criteria: was eating a regular diet, was up and ambulating independently,  pain was well controlled, and was ready to for discharge.   Laboratory values:   Recent Labs  03/27/15 1345 03/29/15 1239 03/30/15 0503 03/30/15 1129  WBC 8.0  --   --   --   HGB 15.9 12.2* 10.9* 11.2*  HCT 48.0 37.1* 33.6* 34.2*    Recent Labs  03/27/15 1345 03/30/15 0503  NA 140 136  K 4.5 3.6  CL 105 105  CO2 27 27  GLUCOSE 98 149*  BUN 21* 11  CREATININE 0.91 0.92  CALCIUM 8.9 7.5*   No results for input(s): LABPT, INR in the last 72 hours. No results for input(s): LABURIN in the last 72 hours. Results for orders placed or performed during the hospital encounter of 03/27/15  Urine culture     Status: None   Collection Time: 03/27/15  2:45 PM  Result Value Ref Range Status   Specimen Description URINE, CLEAN CATCH  Final   Special Requests NONE  Final   Culture   Final    NO GROWTH 2 DAYS Performed at [facility]    Report Status 03/29/2015 FINAL  Final    Disposition: Home  Discharge instruction: The patient was instructed to be ambulatory but told to refrain from heavy lifting, strenuous  activity, or driving.   Discharge medications:   Medication List    STOP taking these medications        HYDROcodone-acetaminophen 10-325 MG per tablet  Commonly known as:  NORCO     meloxicam 15 MG tablet  Commonly known as:  MOBIC      TAKE these medications        buPROPion 300 MG 24 hr tablet  Commonly known as:  WELLBUTRIN XL  Take 1 tablet (300 mg total) by mouth daily.     ciprofloxacin 500 MG tablet  Commonly known as:  CIPRO  Take 1 tablet (500 mg total) by mouth 2 (two) times daily. Start day prior to office visit for foley removal     clonazePAM 0.5 MG tablet  Commonly known as:  KLONOPIN  Take 1 tablet (0.5 mg total) by mouth 2 (two) times daily as needed for anxiety.     docusate sodium 100 MG capsule  Commonly known as:  COLACE  Take 1 capsule (100 mg total) by mouth 2 (two) times daily as needed (take to keep stool soft.).     oxyCODONE 5 MG immediate release tablet  Commonly known as:  Oxy IR/ROXICODONE  Take 1-2 tablets (5-10 mg total) by mouth every 4 (four) hours as needed for moderate pain.     simvastatin 10 MG tablet  Commonly known as:  ZOCOR  Take 1 tablet (10 mg total) by mouth at bedtime.        Followup:      Follow-up Information    Follow up with Ardis Hughs, MD On 04/04/2015.   Specialty:  Urology   Why:  at 3:15   Contact information:   [address] [place] [id number] [phone]

## 2015-04-06 ENCOUNTER — Ambulatory Visit (INDEPENDENT_AMBULATORY_CARE_PROVIDER_SITE_OTHER): Payer: BLUE CROSS/BLUE SHIELD | Admitting: Internal Medicine

## 2015-04-06 ENCOUNTER — Encounter: Payer: Self-pay | Admitting: Internal Medicine

## 2015-04-06 VITALS — BP 106/72 | HR 100 | Temp 98.3°F | Wt 143.8 lb

## 2015-04-06 DIAGNOSIS — M109 Gout, unspecified: Secondary | ICD-10-CM

## 2015-04-06 DIAGNOSIS — Z9889 Other specified postprocedural states: Secondary | ICD-10-CM

## 2015-04-06 DIAGNOSIS — Z9079 Acquired absence of other genital organ(s): Secondary | ICD-10-CM

## 2015-04-06 LAB — URIC ACID: Uric Acid, Serum: 6.9 mg/dL (ref 4.0–7.8)

## 2015-04-06 MED ORDER — ALLOPURINOL 300 MG PO TABS: 300.0000 mg | ORAL_TABLET | Freq: Every day | ORAL | Status: DC

## 2015-04-06 MED ORDER — INDOMETHACIN 50 MG PO CAPS: 50.0000 mg | ORAL_CAPSULE | Freq: Three times a day (TID) | ORAL | Status: DC

## 2015-04-06 NOTE — Progress Notes (Signed)
   Subjective:    Patient ID: Patrick Sanchez, male    DOB: 03/16/1961, 54 y.o.   MRN: [id number]  HPI Patient is status post robotic prostatectomy for prostate cancer. He is recovered well. However awakened this morning with acute pain left first MTP joint. He thought his foot had become entangled in a sheet. Last night he ate a steak and drink alcohol. Having difficulty walking due to pain.    Review of Systems     Objective:   Physical Exam  Erythema first MTP joint with increased warmth and excruciating tenderness with minimal palpation      Assessment & Plan:  Acute gout left great toe MTP joint  Status post robotic prostatectomy  Plan: Indocin 50 mg 3 times daily for 7-10 days. At the end of seven-days start allopurinol 300 mg daily. Uric acid drawn and pending.

## 2015-04-06 NOTE — Patient Instructions (Signed)
Cut back on red meat and alcohol. In 7 days start allopurinol 300 mg daily. Take Indocin 50 mg 3 times daily with a meal for 7-10 days.

## 2015-04-07 ENCOUNTER — Telehealth: Payer: Self-pay | Admitting: *Deleted

## 2015-04-11 ENCOUNTER — Other Ambulatory Visit: Payer: Self-pay | Admitting: Internal Medicine

## 2015-04-11 NOTE — Telephone Encounter (Signed)
Reviewed results with patient. 

## 2015-04-12 ENCOUNTER — Telehealth: Payer: Self-pay | Admitting: Internal Medicine

## 2015-04-12 NOTE — Telephone Encounter (Signed)
Notified patient that temporary disability handicap placard is ready for him to pick up.  We have approved this for 6 months for patient related to his prostate issues.  Copy made to have scanned for patient's chart.

## 2015-04-12 NOTE — Telephone Encounter (Signed)
Patient wants to know if you would write for him to have a temporary handicap placard for the next several months while he's undergoing surgery, etc.?  (states that he will have a limit on weight bearing, etc.)

## 2015-04-12 NOTE — Telephone Encounter (Signed)
Please complete and I will sign

## 2015-05-23 ENCOUNTER — Other Ambulatory Visit: Payer: Self-pay | Admitting: Internal Medicine

## 2015-05-24 NOTE — Telephone Encounter (Signed)
Refill x 3 months 

## 2015-06-29 ENCOUNTER — Encounter: Payer: Self-pay | Admitting: Internal Medicine

## 2015-06-29 ENCOUNTER — Ambulatory Visit (INDEPENDENT_AMBULATORY_CARE_PROVIDER_SITE_OTHER): Payer: BLUE CROSS/BLUE SHIELD | Admitting: Internal Medicine

## 2015-06-29 VITALS — BP 118/80 | HR 83 | Temp 97.4°F | Ht 67.0 in | Wt 154.0 lb

## 2015-06-29 DIAGNOSIS — Z79899 Other long term (current) drug therapy: Secondary | ICD-10-CM | POA: Diagnosis not present

## 2015-06-29 DIAGNOSIS — M109 Gout, unspecified: Secondary | ICD-10-CM

## 2015-06-29 DIAGNOSIS — M10072 Idiopathic gout, left ankle and foot: Secondary | ICD-10-CM | POA: Diagnosis not present

## 2015-06-29 LAB — BASIC METABOLIC PANEL
BUN: 15 mg/dL (ref 7–25)
CO2: 27 mmol/L (ref 20–31)
Calcium: 9 mg/dL (ref 8.6–10.3)
Chloride: 103 mmol/L (ref 98–110)
Creat: 0.94 mg/dL (ref 0.70–1.33)
Glucose, Bld: 84 mg/dL (ref 65–99)
Potassium: 4.6 mmol/L (ref 3.5–5.3)
Sodium: 142 mmol/L (ref 135–146)

## 2015-06-29 LAB — URIC ACID: Uric Acid, Serum: 3.1 mg/dL — ABNORMAL LOW (ref 4.0–7.8)

## 2015-06-29 NOTE — Progress Notes (Signed)
   Subjective:    Patient ID: Patrick Sanchez, male    DOB: Apr 19, 1961, 54 y.o.   MRN: [id number]  HPI  In today with another bout of gout. Says his been taking allopurinol 300 mg daily. He has several alcoholic drinks a night which I think is aggravating the situation. We did draw uric acid level today. Explained him that we could not increase dose of Allopurinol.  If he continues to have bouts of gout, we may need to change to Uloric.  He is status post prostatectomy for prostate cancer. Having issues with erectile dysfunction. His been taking Cialis every other day according to urologist but still having issues. Also having some urinary incontinence. We discussed this today. He may need to talk with urologist about other options for erectile dysfunction.   Review of Systems     Objective:   Physical Exam  Red tender first MTP joint consistent with acute gout      Assessment & Plan:  Acute gout MTP joint  Plan: He has refills on Indocin 50 mg 3 times daily which he should take for 7-10 days.

## 2015-06-29 NOTE — Patient Instructions (Signed)
Take Indocin 50 mg 3 times daily for 7-10 days. Continue allopurinol. Cut back on alcohol consumption. Speak with urologist about erectile dysfunction options. If gout recurs, consider switching to [employer]

## 2015-06-30 ENCOUNTER — Telehealth: Payer: Self-pay | Admitting: *Deleted

## 2015-06-30 NOTE — Telephone Encounter (Signed)
Reviewed lab results and instructions with patient 

## 2015-08-08 ENCOUNTER — Other Ambulatory Visit: Payer: Self-pay | Admitting: Internal Medicine

## 2015-09-04 ENCOUNTER — Encounter: Payer: Self-pay | Admitting: Internal Medicine

## 2015-09-04 ENCOUNTER — Ambulatory Visit (INDEPENDENT_AMBULATORY_CARE_PROVIDER_SITE_OTHER): Payer: BLUE CROSS/BLUE SHIELD | Admitting: Internal Medicine

## 2015-09-04 VITALS — BP 112/78 | HR 80 | Temp 97.5°F | Resp 20 | Ht 67.0 in | Wt 147.0 lb

## 2015-09-04 DIAGNOSIS — Z23 Encounter for immunization: Secondary | ICD-10-CM

## 2015-09-04 DIAGNOSIS — N5234 Erectile dysfunction following simple prostatectomy: Secondary | ICD-10-CM | POA: Diagnosis not present

## 2015-09-04 DIAGNOSIS — N529 Male erectile dysfunction, unspecified: Secondary | ICD-10-CM | POA: Insufficient documentation

## 2015-09-04 NOTE — Patient Instructions (Addendum)
We'll need to get MRI of the right upper arm prior authorized. Try Viagra for erectile dysfunction.

## 2015-09-04 NOTE — Progress Notes (Signed)
   Subjective:    Patient ID: Patrick Sanchez, male    DOB: 1961/05/17, 54 y.o.   MRN: [id number]  HPI Patient was seen September 2013 by Dr. Janice Coffin at [facility] upon referral by Dr. Gladstone Lighter at [facility] regarding a mass in his right upper arm and had developed about 6 weeks before appointment. He was offered choice of surgery to remove the mass or follow-up in 3 months with another MRI. An MRI had been done at [facility] which was reviewed by Dr. Warren Lacy. Mass effaced the ulnar nerve and was within the medial triceps muscle. The brachial artery and vein as well as median nerve surrounded the mass. It was thought to be complicated surgery so patient elected to have another MRI in 3 months as suggested by orthopedist versus surgery, but had financial difficulties and could not pursue study at that time. He now thinks the mass hurts at times. Says he feels twinges of pain from time to time. He would like to have it reevaluated with another MRI.  Patient also has a history of prostate cancer. He is almost now fully continent after going to physical therapy for incontinence. Has issues with erectile dysfunction. Has been on daily Cialis but says it's very expensive at $300 a month. Given coupon for Viagra. He has appointment with urologist in the upcoming future. He says penile pump is a possibility.  Review of Systems     Objective:   Physical Exam  Mass has not changed significantly in size is measured previously by Dr. Mylo Red. Previously measured by Dr. Mylo Red 9.5 x 5.3 x 3.7 cm. It is located right upper medial arm.      Assessment & Plan:  Mass right upper arm- possible lipoma. Mass effaces the ulnar nerve on previous MRI in 2013.  Erectile dysfunction status post surgery for prostate cancer  Plan: Will need to get approval for MRI of the right upper arm. Patient will be out of town November 30 through December 10. He wants to get study done  before the end of the year because he has met his deductible. Coupon and prescription for Viagra 100 mg #6 tablets with 2 refills to try for erectile dysfunction.

## 2015-09-25 ENCOUNTER — Telehealth: Payer: Self-pay

## 2015-09-25 DIAGNOSIS — R2231 Localized swelling, mass and lump, right upper limb: Secondary | ICD-10-CM

## 2015-09-25 NOTE — Telephone Encounter (Signed)
Need to order an MRI upper arm

## 2015-09-26 ENCOUNTER — Telehealth: Payer: Self-pay | Admitting: Internal Medicine

## 2015-09-26 ENCOUNTER — Encounter: Payer: Self-pay | Admitting: Internal Medicine

## 2015-09-26 ENCOUNTER — Ambulatory Visit
Admission: RE | Admit: 2015-09-26 | Discharge: 2015-09-26 | Disposition: A | Discharge status: Home / Self Care | Payer: BLUE CROSS/BLUE SHIELD | Source: Ambulatory Visit | Attending: Internal Medicine | Admitting: Internal Medicine

## 2015-09-26 ENCOUNTER — Ambulatory Visit (INDEPENDENT_AMBULATORY_CARE_PROVIDER_SITE_OTHER): Payer: BLUE CROSS/BLUE SHIELD | Admitting: Internal Medicine

## 2015-09-26 VITALS — BP 120/84 | HR 85 | Temp 97.6°F | Resp 20 | Ht 67.0 in | Wt 152.0 lb

## 2015-09-26 DIAGNOSIS — M25511 Pain in right shoulder: Secondary | ICD-10-CM | POA: Diagnosis not present

## 2015-09-26 DIAGNOSIS — M899 Disorder of bone, unspecified: Secondary | ICD-10-CM

## 2015-09-26 DIAGNOSIS — R2231 Localized swelling, mass and lump, right upper limb: Secondary | ICD-10-CM

## 2015-09-26 MED ORDER — HYDROCODONE-ACETAMINOPHEN 10-325 MG PO TABS: 1.0000 | ORAL_TABLET | Freq: Three times a day (TID) | ORAL | Status: DC | PRN

## 2015-09-26 NOTE — Patient Instructions (Signed)
MRI of right humerus to be done today.  Try to authorize right shoulder MRI  as well.  Further instructions to follow based on results.

## 2015-09-26 NOTE — Telephone Encounter (Signed)
Called patient to explain to MRI results to him. 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 He prefers not to pursue return appointment [facility] regarding this at this point in time. However he has significant tendinosis of the supraspinatus and infraspinatus. Also has a small tear in the supraspinatus. Also tendinosis in the long head of the biceps. He also may have adhesive capsulitis of right shoulder. We will get appointment with Dr. Mardelle Matte 336 [phone] for him. Patient will be out of town until January 6. 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 Have prescribed written prescription for Norco 10/325 #60 one by mouth every 8 hours when necessary shoulder pain

## 2015-09-26 NOTE — Progress Notes (Signed)
   Subjective:    Patient ID: Patrick Sanchez, male    DOB: June 21, 1961, 54 y.o.   MRN: [id number]  HPI He is back from [place] where he was visiting to relocate one of his daughters. He is to have MRI of right humerus today later this afternoon. He's begun to have issues with his right shoulder. Has pain along the right upper scapula area. Still has good range of motion. Denies significant heavy lifting. Thinks he may have bursitis.    Review of Systems     Objective:   Physical Exam  Full range of motion right shoulder although he complains of pain in his he's moving his shoulder up over his head. He has some point tenderness at superior aspect of right scapula      Assessment & Plan:  Right humeral mass-MRI pending  See if MRI of right shoulder can be obtained today as well  Plan: Further instructions to follow based on MRI results

## 2015-09-27 NOTE — Addendum Note (Signed)
Addended by: Beryle Quant on: 09/27/2015 09:47 AM   Modules accepted: Orders

## 2015-10-17 ENCOUNTER — Telehealth: Payer: Self-pay | Admitting: Internal Medicine

## 2015-10-17 ENCOUNTER — Other Ambulatory Visit: Payer: BLUE CROSS/BLUE SHIELD | Admitting: Internal Medicine

## 2015-10-17 NOTE — Telephone Encounter (Signed)
Did not keep appointment for CPE labs. Has canceled physical exam until March 2017 after contacting him today

## 2015-10-19 ENCOUNTER — Encounter: Payer: BLUE CROSS/BLUE SHIELD | Admitting: Internal Medicine

## 2015-10-24 ENCOUNTER — Other Ambulatory Visit: Payer: Self-pay | Admitting: Internal Medicine

## 2015-10-24 NOTE — Telephone Encounter (Signed)
Refill once only. Cancelled CPE last week until March

## 2015-10-25 NOTE — Telephone Encounter (Signed)
Phone to [facility]

## 2015-11-11 ENCOUNTER — Other Ambulatory Visit: Payer: Self-pay | Admitting: Internal Medicine

## 2015-12-26 ENCOUNTER — Other Ambulatory Visit: Payer: BLUE CROSS/BLUE SHIELD | Admitting: Internal Medicine

## 2015-12-28 ENCOUNTER — Other Ambulatory Visit: Payer: BLUE CROSS/BLUE SHIELD | Admitting: Internal Medicine

## 2015-12-28 ENCOUNTER — Other Ambulatory Visit: Payer: Self-pay | Admitting: Internal Medicine

## 2015-12-28 DIAGNOSIS — Z79899 Other long term (current) drug therapy: Secondary | ICD-10-CM

## 2015-12-28 DIAGNOSIS — Z125 Encounter for screening for malignant neoplasm of prostate: Secondary | ICD-10-CM

## 2015-12-28 DIAGNOSIS — Z0000 Encounter for general adult medical examination without abnormal findings: Secondary | ICD-10-CM

## 2015-12-28 DIAGNOSIS — E785 Hyperlipidemia, unspecified: Secondary | ICD-10-CM

## 2015-12-28 LAB — LIPID PANEL
Cholesterol: 210 mg/dL — ABNORMAL HIGH (ref 125–200)
HDL: 66 mg/dL (ref >=40)
LDL Cholesterol: 84 mg/dL (ref <130)
Total CHOL/HDL Ratio: 3.2 Ratio (ref <=5.0)
Triglycerides: 300 mg/dL — ABNORMAL HIGH (ref <150)
VLDL: 60 mg/dL — ABNORMAL HIGH (ref <30)

## 2015-12-28 LAB — COMPLETE METABOLIC PANEL WITH GFR
ALT: 17 U/L (ref 9–46)
AST: 14 U/L (ref 10–35)
Albumin: 3.8 g/dL (ref 3.6–5.1)
Alkaline Phosphatase: 52 U/L (ref 40–115)
BUN: 22 mg/dL (ref 7–25)
CO2: 25 mmol/L (ref 20–31)
Calcium: 8.7 mg/dL (ref 8.6–10.3)
Chloride: 102 mmol/L (ref 98–110)
Creat: 1.11 mg/dL (ref 0.70–1.33)
GFR, Est African American: 87 mL/min (ref >=60)
GFR, Est Non African American: 75 mL/min (ref >=60)
Glucose, Bld: 67 mg/dL (ref 65–99)
Potassium: 4.2 mmol/L (ref 3.5–5.3)
Sodium: 138 mmol/L (ref 135–146)
Total Bilirubin: 0.6 mg/dL (ref 0.2–1.2)
Total Protein: 6.3 g/dL (ref 6.1–8.1)

## 2015-12-28 LAB — CBC WITH DIFFERENTIAL/PLATELET
Basophils Absolute: 0.1 10*3/uL (ref 0.0–0.1)
Basophils Relative: 2 % — ABNORMAL HIGH (ref 0–1)
Eosinophils Absolute: 0.4 10*3/uL (ref 0.0–0.7)
Eosinophils Relative: 5 % (ref 0–5)
HCT: 46.1 % (ref 39.0–52.0)
Hemoglobin: 15.6 g/dL (ref 13.0–17.0)
Lymphocytes Relative: 31 % (ref 12–46)
Lymphs Abs: 2.2 10*3/uL (ref 0.7–4.0)
MCH: 31.3 pg (ref 26.0–34.0)
MCHC: 33.8 g/dL (ref 30.0–36.0)
MCV: 92.4 fL (ref 78.0–100.0)
MPV: 12.2 fL (ref 8.6–12.4)
Monocytes Absolute: 0.6 10*3/uL (ref 0.1–1.0)
Monocytes Relative: 8 % (ref 3–12)
Neutro Abs: 3.8 10*3/uL (ref 1.7–7.7)
Neutrophils Relative %: 54 % (ref 43–77)
Platelets: 237 10*3/uL (ref 150–400)
RBC: 4.99 MIL/uL (ref 4.22–5.81)
RDW: 13.1 % (ref 11.5–15.5)
WBC: 7.1 10*3/uL (ref 4.0–10.5)

## 2015-12-29 LAB — PSA: PSA: <0.01 ng/mL (ref <=4.00)

## 2016-01-02 ENCOUNTER — Encounter: Payer: Self-pay | Admitting: Gastroenterology

## 2016-01-02 ENCOUNTER — Ambulatory Visit (INDEPENDENT_AMBULATORY_CARE_PROVIDER_SITE_OTHER): Payer: BLUE CROSS/BLUE SHIELD | Admitting: Internal Medicine

## 2016-01-02 ENCOUNTER — Encounter: Payer: Self-pay | Admitting: Internal Medicine

## 2016-01-02 VITALS — BP 126/88 | HR 77 | Temp 97.3°F | Resp 20 | Ht 67.0 in | Wt 155.0 lb

## 2016-01-02 DIAGNOSIS — Z8546 Personal history of malignant neoplasm of prostate: Secondary | ICD-10-CM

## 2016-01-02 DIAGNOSIS — M25511 Pain in right shoulder: Secondary | ICD-10-CM | POA: Diagnosis not present

## 2016-01-02 DIAGNOSIS — Z0000 Encounter for general adult medical examination without abnormal findings: Secondary | ICD-10-CM

## 2016-01-02 DIAGNOSIS — E785 Hyperlipidemia, unspecified: Secondary | ICD-10-CM

## 2016-01-02 DIAGNOSIS — N529 Male erectile dysfunction, unspecified: Secondary | ICD-10-CM | POA: Diagnosis not present

## 2016-01-02 DIAGNOSIS — F329 Major depressive disorder, single episode, unspecified: Secondary | ICD-10-CM | POA: Diagnosis not present

## 2016-01-02 DIAGNOSIS — F32A Depression, unspecified: Secondary | ICD-10-CM

## 2016-01-02 LAB — POCT URINALYSIS DIPSTICK
Bilirubin, UA: NEGATIVE
Blood, UA: NEGATIVE
Glucose, UA: NEGATIVE
Ketones, UA: NEGATIVE
Leukocytes, UA: NEGATIVE
Nitrite, UA: NEGATIVE
Protein, UA: NEGATIVE
Spec Grav, UA: 1.015
Urobilinogen, UA: 0.2
pH, UA: 7

## 2016-01-02 MED ORDER — ATORVASTATIN CALCIUM 10 MG PO TABS: 10.0000 mg | ORAL_TABLET | Freq: Every day | ORAL | Status: DC

## 2016-01-02 NOTE — Patient Instructions (Signed)
Change Zocor to Lipitor 10 mg daily and return in 3 months for lipid panel liver functions without office visit. Order placed for screening colonoscopy. Continue other medications as previously prescribed.

## 2016-01-02 NOTE — Progress Notes (Signed)
Subjective:    Patient ID: Patrick Sanchez, male    DOB: May 15, 1961, 55 y.o.   MRN: [id number]  HPI 55 year old White Male in today for health maintenance exam and evaluation of medical problems. He has a history of essential tremor and hyperlipidemia. Has been on statin medication for some time for hyperlipidemia. History of anxiety depression treated with Klonopin and Wellbutrin. This coming June will be a year since his surgery for prostate cancer. He's been doing well. He has erectile dysfunction treated with Viagra status post surgery.  He's never had screening colonoscopy. Referral will be made.  History of large lipoma right upper arm evaluated previously at [facility]. Surgery not recommended.  He's had right frozen shoulder and saw Dr. Mardelle Matte recently who injected his shoulder without any improvement. He subsequently went to physical therapy. He has now quit going to physical therapy. He wasn't doing exercises at home is he should have. He says he is going to try yoga.  He weighs 155 pounds and in December 2015 he weighed 146 pounds  Social history: He is married. He has 2 daughters both living in [place]. Oldest daughter is attending [employer] and the other daughter is working as a Equities trader. 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 He hopes to get a job with real estate currently in the near future. He consumes alcohol daily. Does not smoke. History of DUI.  Family history: Mother died with pancreatic cancer. Father with history of diabetes and MI status post CABG. Apparently father was bedridden at the end of his life due to heart failure.  Patient is allergic to sulfa  History of allergic rhinitis. He was allergy tested by Dr. Donneta Romberg in the past and had reaction to perennial allergens. Allergy vaccinations were recommended but  patient never pursued those. History of psoriasis. Right inguinal hernia repair 1972. Ganglion cyst removed from left wrist 1971. Mastectomy 1999 by Dr. Joelyn Oms. Thrombosed hemorrhoid incised and drained 1997. Right elbow epicondylitis 2003. Negative Cardiolite study 2003 by Dr. Ron Parker.  Review of lab work shows triglycerides of 300. He is on Zocor 10 mg daily. Will change him to Lipitor 10 mg daily and reevaluate in 3 months.  Review of Systems complains of persistent problems with frozen right shoulder and bilateral tennis elbow issues     Objective:   Physical Exam  Constitutional: He is oriented to person, place, and time. He appears well-developed and well-nourished. No distress.  HENT:  Head: Normocephalic and atraumatic.  Right Ear: External ear normal.  Left Ear: External ear normal.  Mouth/Throat: Oropharynx is clear and moist.  Eyes: Conjunctivae and EOM are normal. Pupils are equal, round, and reactive to light. Right eye exhibits no discharge. Left eye exhibits no discharge. No scleral icterus.  Neck: Neck supple. No JVD present. No thyromegaly present.  Cardiovascular: Normal rate, regular rhythm, normal heart sounds and intact distal pulses.   No murmur heard. Pulmonary/Chest: Effort normal and breath sounds normal. No respiratory distress. He has no wheezes. He has no rales.  Abdominal: Soft. Bowel sounds are normal. He exhibits no distension and no mass. There is no tenderness. There is no rebound and no guarding.  Genitourinary:  Deferred to urologist  Musculoskeletal: Normal range of motion. He exhibits no edema.  Lymphadenopathy:    He has no cervical adenopathy.  Neurological: He is  alert and oriented to person, place, and time. He has normal reflexes. No cranial nerve deficit. Coordination normal.  Skin: Skin is warm and dry. No rash noted. He is not diaphoretic.  Psychiatric: He has a normal mood and affect. His behavior is normal. Judgment and thought content normal.    Vitals reviewed.         Assessment & Plan:  History of prostate cancer-followed by urologist. PSA obtained and reviewed and stable  Hyperlipidemia-change from Zocor to Lipitor because of elevated triglycerides in follow-up in 3 months without office visit. He'll have fasting lipid panel liver functions on Lipitor 10 mg daily  Anxiety depression treated with Klonopin sparingly and Wellbutrin daily  History of right frozen shoulder  Bilateral tennis elbow  Essential tremor  Health maintenance-needs screening colonoscopy and referral will be placed in EPIC  Allergic rhinitis  Erectile dysfunction treated with Viagra  Plan: See above regarding follow-up in 3 months

## 2016-01-03 LAB — HEMOGLOBIN A1C
Hgb A1c MFr Bld: 5.6 % (ref <5.7)
Mean Plasma Glucose: 114 mg/dL

## 2016-01-26 ENCOUNTER — Ambulatory Visit (AMBULATORY_SURGERY_CENTER): Payer: Self-pay | Admitting: *Deleted

## 2016-01-26 VITALS — Ht 67.0 in | Wt 154.4 lb

## 2016-01-26 DIAGNOSIS — Z1211 Encounter for screening for malignant neoplasm of colon: Secondary | ICD-10-CM

## 2016-01-26 NOTE — Progress Notes (Signed)
No egg or soy allergy known to patient  No issues with past sedation with any surgeries  or procedures, no intubation problems  No diet pills per patient No home 02 use per patient  No blood thinners per patient  Pt denies issues with constipation  emmi video declined

## 2016-02-06 ENCOUNTER — Encounter: Payer: Self-pay | Admitting: Gastroenterology

## 2016-02-06 ENCOUNTER — Ambulatory Visit (AMBULATORY_SURGERY_CENTER): Payer: BLUE CROSS/BLUE SHIELD | Admitting: Gastroenterology

## 2016-02-06 VITALS — BP 118/84 | HR 80 | Temp 98.4°F | Resp 14 | Ht 67.0 in | Wt 154.0 lb

## 2016-02-06 DIAGNOSIS — Z1211 Encounter for screening for malignant neoplasm of colon: Secondary | ICD-10-CM

## 2016-02-06 DIAGNOSIS — D123 Benign neoplasm of transverse colon: Secondary | ICD-10-CM

## 2016-02-06 DIAGNOSIS — D127 Benign neoplasm of rectosigmoid junction: Secondary | ICD-10-CM

## 2016-02-06 MED ORDER — SODIUM CHLORIDE 0.9 % IV SOLN: 500.0000 mL | INTRAVENOUS | Status: DC

## 2016-02-06 NOTE — Op Note (Signed)
[place] Patient Name: Patrick Sanchez Procedure Date: 02/06/2016 2:06 PM MRN: [id number] Endoscopist: Remo Lipps P. Havery Moros , MD Age: 55 Date of Birth: [birth year] Gender: Male Procedure:                Colonoscopy Indications:              Screening for malignant neoplasm in the colon, This                            is the patient's first colonoscopy Medicines:                Monitored Anesthesia Care Procedure:                Pre-Anesthesia Assessment:                           - Prior to the procedure, a History and Physical                            was performed, and patient medications and                            allergies were reviewed. The patient's tolerance of                            previous anesthesia was also reviewed. The risks                            and benefits of the procedure and the sedation                            options and risks were discussed with the patient.                            All questions were answered, and informed consent                            was obtained. Prior Anticoagulants: The patient has                            taken no previous anticoagulant or antiplatelet                            agents. ASA Grade Assessment: II - A patient with                            mild systemic disease. After reviewing the risks                            and benefits, the patient was deemed in                            satisfactory condition to undergo the procedure.  After obtaining informed consent, the colonoscope                            was passed under direct vision. Throughout the                            procedure, the patient's blood pressure, pulse, and                            oxygen saturations were monitored continuously. The                            Model CF-HQ190L [id number]) scope was introduced                            through the anus and advanced to the the terminal                      ileum, with identification of the appendiceal                            orifice and IC valve. The colonoscopy was performed                            without difficulty. The patient tolerated the                            procedure well. The quality of the bowel                            preparation was adequate. The terminal ileum,                            ileocecal valve, appendiceal orifice, and rectum                            were photographed. Scope In: 2:13:21 PM Scope Out: 2:35:27 PM Scope Withdrawal Time: 0 hours 18 minutes 39 seconds  Total Procedure Duration: 0 hours 22 minutes 6 seconds  Findings:                 The perianal and digital rectal examinations were                            normal.                           Multiple medium-mouthed diverticula were found in                            the left colon.                           A single small angiodysplastic lesion without  bleeding was found in the transverse colon.                           A 4 mm polyp was found in the splenic flexure. The                            polyp was sessile. The polyp was removed with a                            cold snare. Resection and retrieval were complete.                           A 5 mm polyp was found in the recto-sigmoid colon.                            The polyp was sessile. The polyp was removed with a                            cold snare. Resection and retrieval were complete.                           The terminal ileum appeared normal.                           The exam was otherwise without abnormality. Complications:            No immediate complications. Estimated blood loss:                            Minimal. Estimated Blood Loss:     Estimated blood loss was minimal. Impression:               - Diverticulosis in the left colon.                           - A single non-bleeding colonic angiodysplastic                             lesion.                           - One 4 mm polyp at the splenic flexure, removed                            with a cold snare. Resected and retrieved.                           - One 5 mm polyp at the recto-sigmoid colon,                            removed with a cold snare. Resected and retrieved.                           - The examined portion of the ileum was normal.                           -  The examination was otherwise normal. Recommendation:           - Patient has a contact number available for                            emergencies. The signs and symptoms of potential                            delayed complications were discussed with the                            patient. Return to normal activities tomorrow.                            Written discharge instructions were provided to the                            patient.                           - Resume previous diet.                           - Continue present medications.                           - No aspirin, ibuprofen, naproxen, or other                            non-steroidal anti-inflammatory drugs for 2 weeks                            after polyp removal.                           - Await pathology results.                           - Repeat colonoscopy is recommended for                            surveillance. The colonoscopy date will be                            determined after pathology results from today's                            exam become available for review. Remo Lipps P. Yosiel Thieme, MD 02/06/2016 2:39:46 PM This report has been signed electronically.

## 2016-02-06 NOTE — Progress Notes (Signed)
Called to room to assist during endoscopic procedure.  Patient ID and intended procedure confirmed with present staff. Received instructions for my participation in the procedure from the performing physician.  

## 2016-02-06 NOTE — Progress Notes (Signed)
A/ox3 pleased with MAC, report to Wendy RN 

## 2016-02-06 NOTE — Patient Instructions (Signed)
YOU HAD AN ENDOSCOPIC PROCEDURE TODAY AT [facility]:   Refer to the procedure report that was given to you for any specific questions about what was found during the examination.  If the procedure report does not answer your questions, please call your gastroenterologist to clarify.  If you requested that your care partner not be given the details of your procedure findings, then the procedure report has been included in a sealed envelope for you to review at your convenience later.  YOU SHOULD EXPECT: Some feelings of bloating in the abdomen. Passage of more gas than usual.  Walking can help get rid of the air that was put into your GI tract during the procedure and reduce the bloating. If you had a lower endoscopy (such as a colonoscopy or flexible sigmoidoscopy) you may notice spotting of blood in your stool or on the toilet paper. If you underwent a bowel prep for your procedure, you may not have a normal bowel movement for a few days.  Please Note:  You might notice some irritation and congestion in your nose or some drainage.  This is from the oxygen used during your procedure.  There is no need for concern and it should clear up in a day or so.  SYMPTOMS TO REPORT IMMEDIATELY:   Following lower endoscopy (colonoscopy or flexible sigmoidoscopy):  Excessive amounts of blood in the stool  Significant tenderness or worsening of abdominal pains  Swelling of the abdomen that is new, acute  Fever of 100F or higher  For urgent or emergent issues, a gastroenterologist can be reached at any hour by calling [phone].   DIET: Your first meal following the procedure should be a small meal and then it is ok to progress to your normal diet. Heavy or fried foods are harder to digest and may make you feel nauseous or bloated.  Likewise, meals heavy in dairy and vegetables can increase bloating.  Drink plenty of fluids but you should avoid alcoholic beverages for 24  hours.  ACTIVITY:  You should plan to take it easy for the rest of today and you should NOT DRIVE or use heavy machinery until tomorrow (because of the sedation medicines used during the test).    FOLLOW UP: Our staff will call the number listed on your records the next business day following your procedure to check on you and address any questions or concerns that you may have regarding the information given to you following your procedure. If we do not reach you, we will leave a message.  However, if you are feeling well and you are not experiencing any problems, there is no need to return our call.  We will assume that you have returned to your regular daily activities without incident.  If any biopsies were taken you will be contacted by phone or by letter within the next 1-3 weeks.  Please call us at [phone] if you have not heard about the biopsies in 3 weeks.    SIGNATURES/CONFIDENTIALITY: You and/or your care partner have signed paperwork which will be entered into your electronic medical record.  These signatures attest to the fact that that the information above on your After Visit Summary has been reviewed and is understood.  Full responsibility of the confidentiality of this discharge information lies with you and/or your care-partner.  No aspirin, ibuprofen, naproxen, aleve, or other non-steroidal anti-inflammatory drugs for 2 weeks after polyp removal. Please review polyp, diverticulosis, and high fiber diet, handouts  provided.

## 2016-02-07 ENCOUNTER — Telehealth: Payer: Self-pay | Admitting: *Deleted

## 2016-02-07 NOTE — Telephone Encounter (Signed)
  Follow up Call-  Call back number 02/06/2016  Post procedure Call Back phone  # [phone]  Permission to leave phone message Yes   [facility]

## 2016-02-12 ENCOUNTER — Encounter: Payer: Self-pay | Admitting: Gastroenterology

## 2016-03-19 DIAGNOSIS — Z8546 Personal history of malignant neoplasm of prostate: Secondary | ICD-10-CM | POA: Diagnosis not present

## 2016-03-25 DIAGNOSIS — Z8546 Personal history of malignant neoplasm of prostate: Secondary | ICD-10-CM | POA: Diagnosis not present

## 2016-03-27 ENCOUNTER — Telehealth: Payer: Self-pay | Admitting: Internal Medicine

## 2016-03-27 MED ORDER — CLONAZEPAM 0.5 MG PO TABS: 0.5000 mg | ORAL_TABLET | Freq: Two times a day (BID) | ORAL | Status: DC | PRN

## 2016-03-27 NOTE — Telephone Encounter (Signed)
Refill Klonopin x 3 months.

## 2016-03-27 NOTE — Telephone Encounter (Signed)
Saw the Urologist and his PSA came back elevated.  He had to have it re-drawn and is awaiting further testing for the next 6 weeks.  He wants to know if you will refill his Klonopin?    Pharmacist:  [facility] [place].

## 2016-04-04 ENCOUNTER — Other Ambulatory Visit: Payer: BLUE CROSS/BLUE SHIELD | Admitting: Internal Medicine

## 2016-04-28 ENCOUNTER — Telehealth: Payer: Self-pay | Admitting: Internal Medicine

## 2016-04-28 ENCOUNTER — Encounter: Payer: Self-pay | Admitting: Internal Medicine

## 2016-04-28 MED ORDER — BUPROPION HCL ER (XL) 300 MG PO TB24: 300.0000 mg | ORAL_TABLET | Freq: Every day | ORAL | 5 refills | Status: DC

## 2016-04-28 NOTE — Telephone Encounter (Signed)
Refill Wellbutrin x 6 months. Last CPE was in March 2017.

## 2016-05-03 ENCOUNTER — Other Ambulatory Visit: Payer: Self-pay

## 2016-05-06 DIAGNOSIS — Z8546 Personal history of malignant neoplasm of prostate: Secondary | ICD-10-CM | POA: Diagnosis not present

## 2016-06-12 IMAGING — MR MR SHOULDER*R* W/O CM
4 of 5 series · 17 of 40 positions shown · non-contrast
Comparison: None.

CLINICAL DATA: Right shoulder pain along the scapular area. Good
range of motion.

EXAM:
MRI OF THE RIGHT SHOULDER WITHOUT CONTRAST
TECHNIQUE: Multiplanar, multisequence MR imaging of the shoulder was performed.
No intravenous contrast was administered.

[Series 6: T2 fat-sat · axial · right · 3.0mm · 0.44mm/px · z∈[-58,+23]mm · 4 of 27 slices shown (1 of 3)]
[im 1/27]
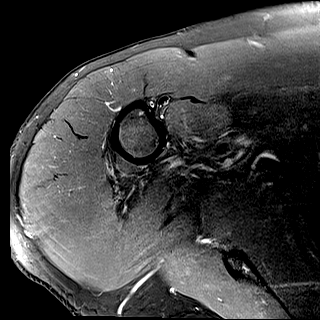
[im 3/27]
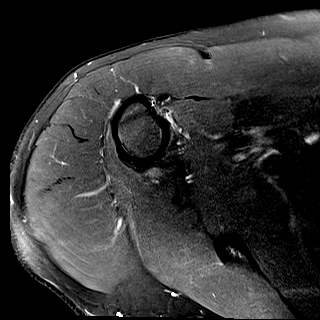
[im 15/27]
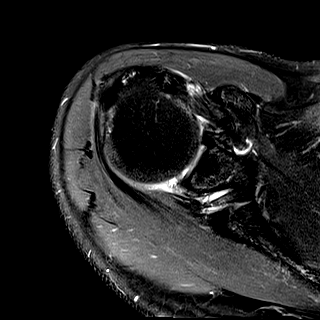
[im 24/27]
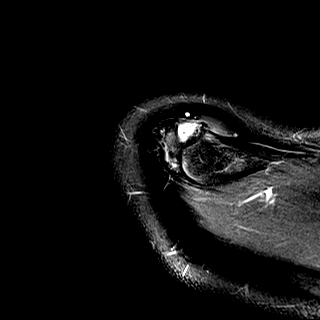

[Series 7: T2 fat-sat · oblique · right · 3.0mm · 0.44mm/px · 3 of 25 slices shown (2 of 3)]
[im 4/25]
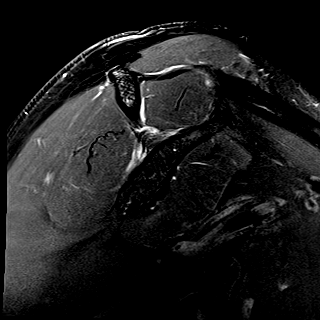
[im 14/25]
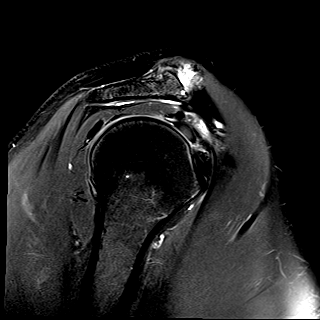
[im 21/25]
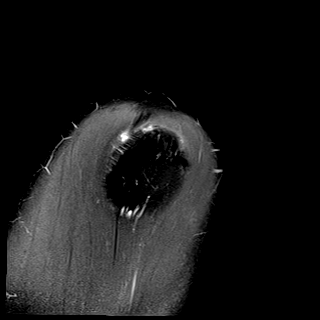

[Series 9: PD · oblique · right · 3.0mm · 0.18mm/px · 7 of 23 slices shown]
[im 1/23]
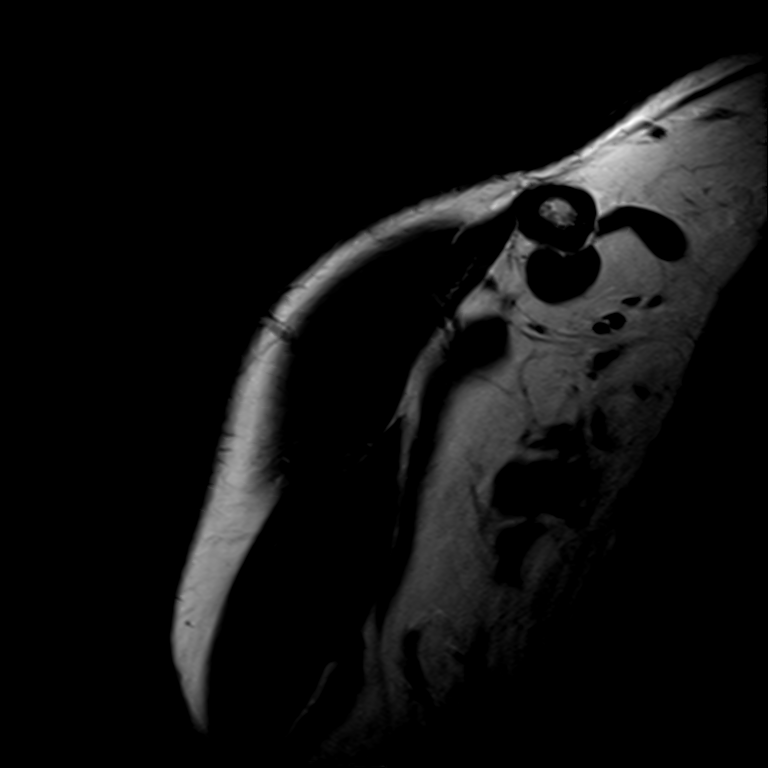
[im 4/23]
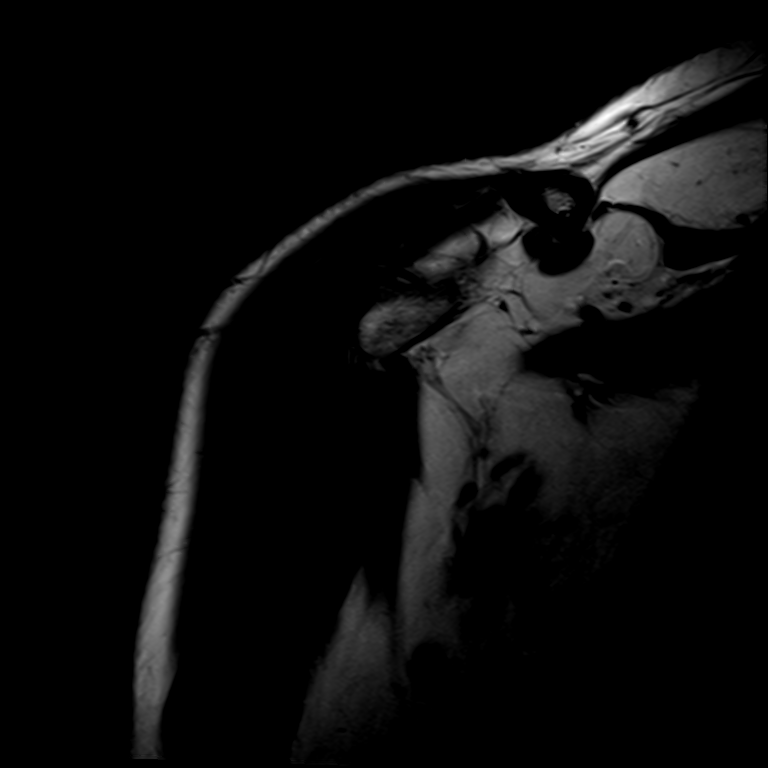
[im 8/23]
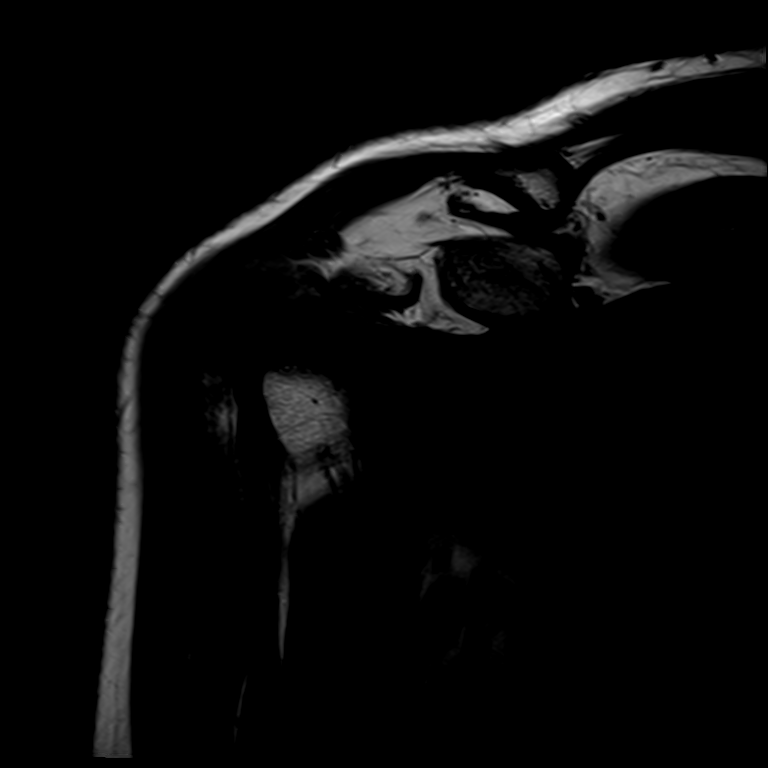
[im 12/23]
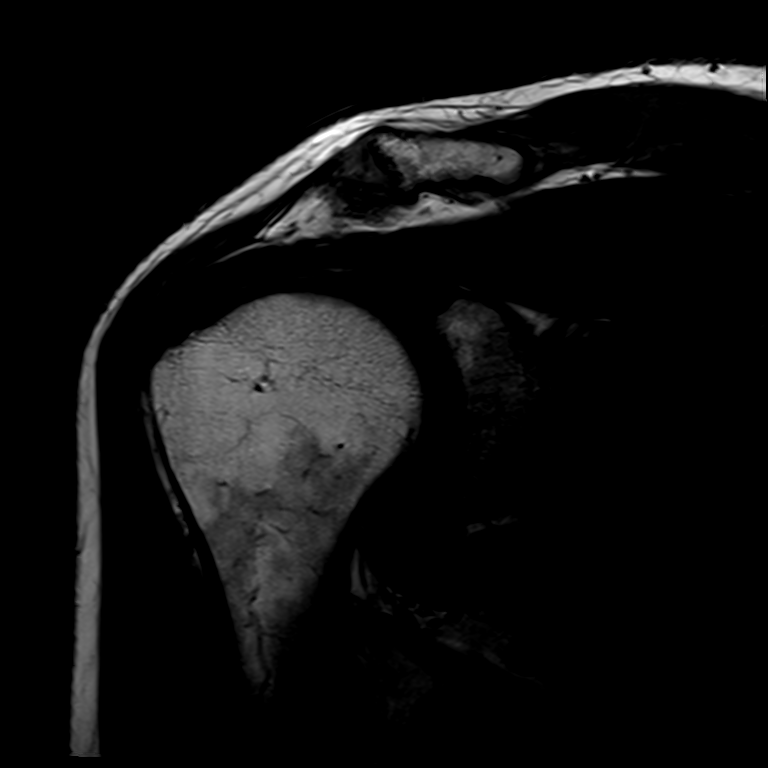
[im 15/23]
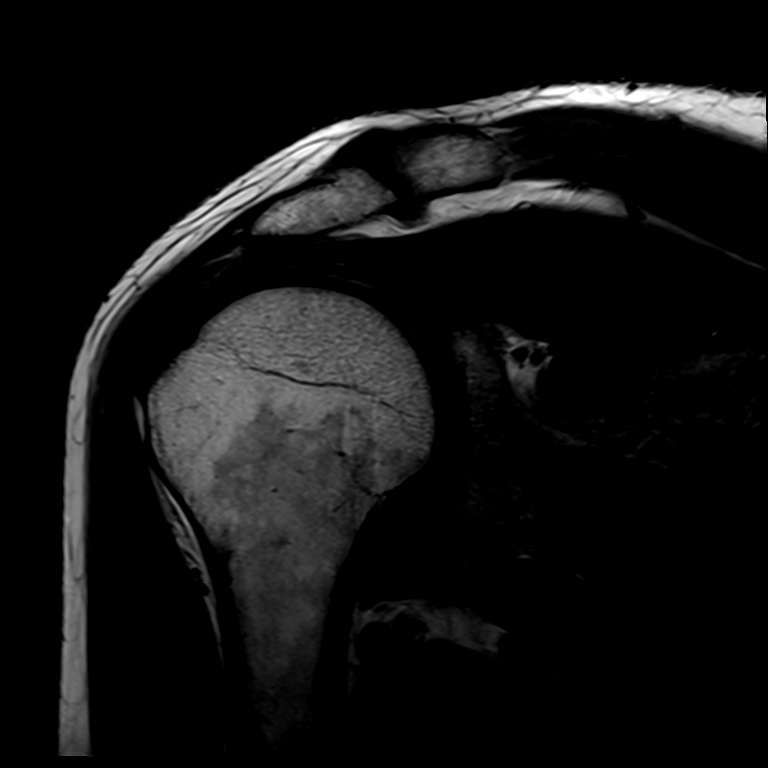
[im 19/23]
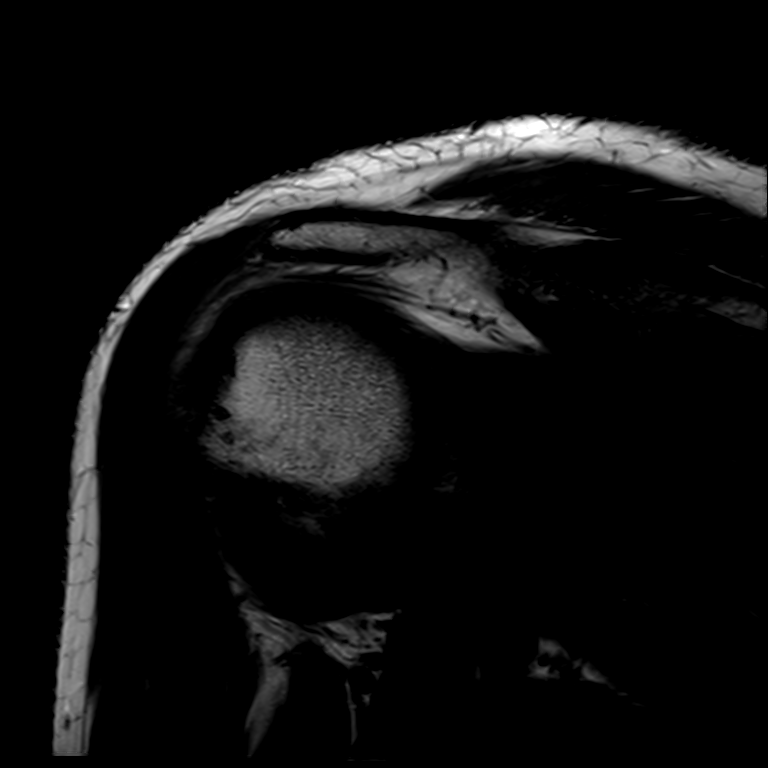
[im 23/23]
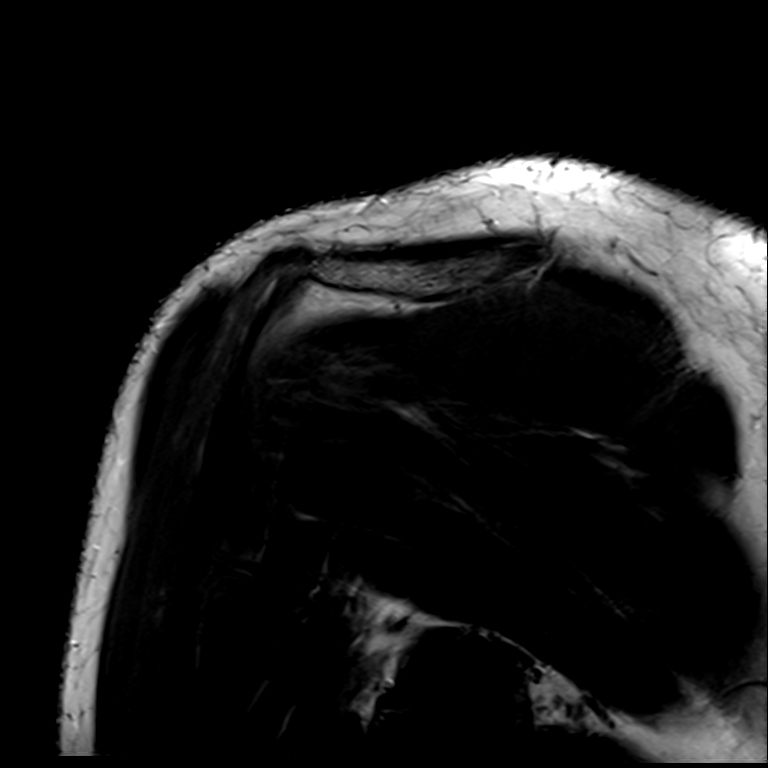

[Series 10: T2 fat-sat · oblique · right · 3.0mm · 0.44mm/px · 3 of 23 slices shown (3 of 3)]
[im 4/23]
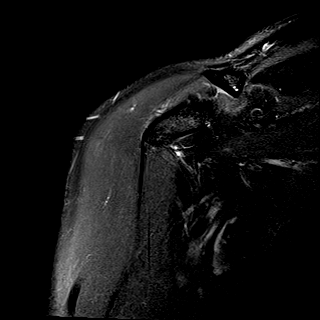
[im 12/23]
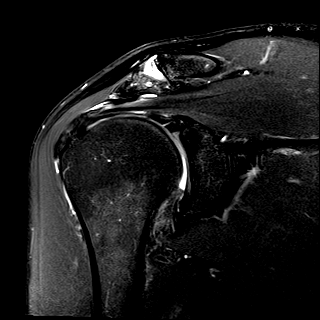
[im 19/23]
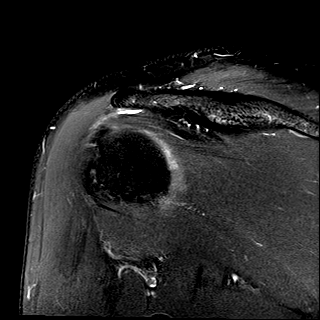

[17 of 40 positions shown; findings below may reference images not displayed]

FINDINGS: Rotator cuff: Moderate tendinosis of the supraspinatus and
infraspinatus tendons with a small partial articular surface tear of
the supraspinatus tendon. Teres minor tendon is intact.
Subscapularis tendon is intact.

Muscles: No atrophy or fatty replacement of nor abnormal signal
within, the muscles of the rotator cuff.

Biceps long head: Moderate tendinosis of the intraarticular portion
of the long head of the biceps tendon.

Acromioclavicular Joint: Moderate degenerative changes of the
acromioclavicular joint. Small amount of subacromial/ subdeltoid
bursal fluid.

Glenohumeral Joint: No joint effusion. No chondral defect. Mild
thickening of the posterior inferior glenohumeral joint capsule as
can be seen with adhesive capsulitis.

Labrum: Grossly intact, but evaluation is limited by lack of
intraarticular fluid.

Bones:  No marrow signal abnormality.  No fracture or dislocation.
IMPRESSION: 1. Moderate tendinosis of the supraspinatus and infraspinatus
tendons with a small partial articular surface tear of the
supraspinatus tendon.
2. Moderate tendinosis of the intraarticular portion of the long
head of the biceps tendon.
3. Mild thickening of the posterior inferior glenohumeral joint
capsule as can be seen with adhesive capsulitis.

## 2016-07-17 ENCOUNTER — Other Ambulatory Visit: Payer: Self-pay | Admitting: Internal Medicine

## 2016-07-17 NOTE — Telephone Encounter (Signed)
Verbal order by Dr. Renold Genta; ok to refill for 30 days.  Sent to [facility] to e-scribe.

## 2016-07-30 ENCOUNTER — Other Ambulatory Visit: Payer: Self-pay | Admitting: Internal Medicine

## 2016-08-22 ENCOUNTER — Other Ambulatory Visit: Payer: Self-pay | Admitting: Internal Medicine

## 2016-09-16 ENCOUNTER — Other Ambulatory Visit: Payer: Self-pay | Admitting: Internal Medicine

## 2016-09-16 NOTE — Telephone Encounter (Signed)
Please call and schedule CPE for patient, I will then call in clonazepam refill.

## 2016-09-16 NOTE — Telephone Encounter (Addendum)
Please book CPE after 3/29. OK to refill until then  December 29,2018 PHYSICIAN NOTE:Pt was to have booked CPE after March 29 before refilling. Received another refill request today on Klonopin before holiday weekend. No CPE in book or in EPIC. I will call in #[phone] today with understanding that no more refills to be given until CPE is booked.Patient has been mailed a note to that effect.

## 2016-09-19 DIAGNOSIS — Z8546 Personal history of malignant neoplasm of prostate: Secondary | ICD-10-CM | POA: Diagnosis not present

## 2016-09-23 DIAGNOSIS — N5201 Erectile dysfunction due to arterial insufficiency: Secondary | ICD-10-CM | POA: Diagnosis not present

## 2016-09-23 DIAGNOSIS — C61 Malignant neoplasm of prostate: Secondary | ICD-10-CM | POA: Diagnosis not present

## 2016-10-19 ENCOUNTER — Other Ambulatory Visit: Payer: Self-pay | Admitting: Internal Medicine

## 2016-12-06 ENCOUNTER — Encounter: Payer: Self-pay | Admitting: Internal Medicine

## 2016-12-06 ENCOUNTER — Ambulatory Visit (INDEPENDENT_AMBULATORY_CARE_PROVIDER_SITE_OTHER): Payer: BLUE CROSS/BLUE SHIELD | Admitting: Internal Medicine

## 2016-12-06 VITALS — BP 140/100 | HR 112 | Temp 100.1°F | Ht 67.0 in | Wt 155.2 lb

## 2016-12-06 DIAGNOSIS — R05 Cough: Secondary | ICD-10-CM | POA: Diagnosis not present

## 2016-12-06 DIAGNOSIS — J22 Unspecified acute lower respiratory infection: Secondary | ICD-10-CM

## 2016-12-06 DIAGNOSIS — J029 Acute pharyngitis, unspecified: Secondary | ICD-10-CM | POA: Diagnosis not present

## 2016-12-06 DIAGNOSIS — R059 Cough, unspecified: Secondary | ICD-10-CM

## 2016-12-06 LAB — POCT RAPID STREP A (OFFICE): Rapid Strep A Screen: NEGATIVE

## 2016-12-06 MED ORDER — LEVOFLOXACIN 500 MG PO TABS: 500.0000 mg | ORAL_TABLET | Freq: Every day | ORAL | 0 refills | Status: DC

## 2016-12-06 MED ORDER — HYDROCODONE-HOMATROPINE 5-1.5 MG/5ML PO SYRP: 5.0000 mL | ORAL_SOLUTION | Freq: Three times a day (TID) | ORAL | 0 refills | Status: DC | PRN

## 2016-12-06 NOTE — Patient Instructions (Signed)
Levaquin 500 mg daily for 10 days. Hycodan one tsp po q 8 hours prn cough.

## 2016-12-10 NOTE — Progress Notes (Signed)
   Subjective:    Patient ID: Patrick Sanchez, male    DOB: December 26, 1960, 56 y.o.   MRN: [id number]  HPI 56 year old Male in today with respiratory congestion. Started out as scratchy throat then went into cough. No documented fever or shaking chills. Has some malaise and fatigue.     Review of Systems     Objective:   Physical Exam Skin warm and dry. Nodes none. Pharynx slightly injected. TMs are clear. Neck is supple. Chest clear to auscultation without rales or wheezing       Assessment & Plan:  Acute bronchitis  Plan: Levaquin 500 milligrams daily for 10 days. Hycodan 1 teaspoon by mouth every 8 hours when necessary cough. Rest and drink plenty of fluids.

## 2017-01-08 ENCOUNTER — Other Ambulatory Visit: Payer: Self-pay | Admitting: Internal Medicine

## 2017-01-14 ENCOUNTER — Other Ambulatory Visit: Payer: BLUE CROSS/BLUE SHIELD | Admitting: Internal Medicine

## 2017-01-16 ENCOUNTER — Encounter: Payer: BLUE CROSS/BLUE SHIELD | Admitting: Internal Medicine

## 2017-02-07 ENCOUNTER — Encounter: Payer: Self-pay | Admitting: Internal Medicine

## 2017-03-11 ENCOUNTER — Other Ambulatory Visit: Payer: BLUE CROSS/BLUE SHIELD | Admitting: Internal Medicine

## 2017-03-11 DIAGNOSIS — C61 Malignant neoplasm of prostate: Secondary | ICD-10-CM

## 2017-03-11 DIAGNOSIS — E785 Hyperlipidemia, unspecified: Secondary | ICD-10-CM

## 2017-03-11 DIAGNOSIS — Z0000 Encounter for general adult medical examination without abnormal findings: Secondary | ICD-10-CM | POA: Diagnosis not present

## 2017-03-11 DIAGNOSIS — Z130 Encounter for screening for diseases of the blood and blood-forming organs and certain disorders involving the immune mechanism: Secondary | ICD-10-CM | POA: Diagnosis not present

## 2017-03-11 LAB — LIPID PANEL
Cholesterol: 251 mg/dL — ABNORMAL HIGH (ref <200)
HDL: 60 mg/dL (ref >40)
LDL Cholesterol: 124 mg/dL — ABNORMAL HIGH (ref <100)
Total CHOL/HDL Ratio: 4.2 Ratio (ref <5.0)
Triglycerides: 334 mg/dL — ABNORMAL HIGH (ref <150)
VLDL: 67 mg/dL — ABNORMAL HIGH (ref <30)

## 2017-03-11 LAB — COMPLETE METABOLIC PANEL WITH GFR
ALT: 22 U/L (ref 9–46)
AST: 19 U/L (ref 10–35)
Albumin: 3.9 g/dL (ref 3.6–5.1)
Alkaline Phosphatase: 58 U/L (ref 40–115)
BUN: 17 mg/dL (ref 7–25)
CO2: 29 mmol/L (ref 20–31)
Calcium: 8.8 mg/dL (ref 8.6–10.3)
Chloride: 105 mmol/L (ref 98–110)
Creat: 0.95 mg/dL (ref 0.70–1.33)
GFR, Est African American: >89 mL/min (ref >=60)
GFR, Est Non African American: >89 mL/min (ref >=60)
Glucose, Bld: 75 mg/dL (ref 65–99)
Potassium: 4.3 mmol/L (ref 3.5–5.3)
Sodium: 142 mmol/L (ref 135–146)
Total Bilirubin: 0.4 mg/dL (ref 0.2–1.2)
Total Protein: 6.4 g/dL (ref 6.1–8.1)

## 2017-03-11 LAB — CBC WITH DIFFERENTIAL/PLATELET
Basophils Absolute: 116 cells/uL (ref 0–200)
Basophils Relative: 2 %
Eosinophils Absolute: 232 cells/uL (ref 15–500)
Eosinophils Relative: 4 %
HCT: 46.5 % (ref 38.5–50.0)
Hemoglobin: 15.4 g/dL (ref 13.2–17.1)
Lymphocytes Relative: 33 %
Lymphs Abs: 1914 cells/uL (ref 850–3900)
MCH: 31.8 pg (ref 27.0–33.0)
MCHC: 33.1 g/dL (ref 32.0–36.0)
MCV: 95.9 fL (ref 80.0–100.0)
MPV: 11.9 fL (ref 7.5–12.5)
Monocytes Absolute: 406 cells/uL (ref 200–950)
Monocytes Relative: 7 %
Neutro Abs: 3132 cells/uL (ref 1500–7800)
Neutrophils Relative %: 54 %
Platelets: 197 10*3/uL (ref 140–400)
RBC: 4.85 MIL/uL (ref 4.20–5.80)
RDW: 13.8 % (ref 11.0–15.0)
WBC: 5.8 10*3/uL (ref 3.8–10.8)

## 2017-03-12 LAB — PSA: PSA: 0.1 ng/mL (ref <=4.0)

## 2017-03-14 ENCOUNTER — Encounter: Payer: Self-pay | Admitting: Internal Medicine

## 2017-03-14 ENCOUNTER — Ambulatory Visit (INDEPENDENT_AMBULATORY_CARE_PROVIDER_SITE_OTHER): Payer: BLUE CROSS/BLUE SHIELD | Admitting: Internal Medicine

## 2017-03-14 VITALS — BP 118/80 | HR 85 | Temp 97.6°F | Ht 67.0 in | Wt 149.0 lb

## 2017-03-14 DIAGNOSIS — G250 Essential tremor: Secondary | ICD-10-CM | POA: Diagnosis not present

## 2017-03-14 DIAGNOSIS — F419 Anxiety disorder, unspecified: Secondary | ICD-10-CM

## 2017-03-14 DIAGNOSIS — N529 Male erectile dysfunction, unspecified: Secondary | ICD-10-CM

## 2017-03-14 DIAGNOSIS — Z8739 Personal history of other diseases of the musculoskeletal system and connective tissue: Secondary | ICD-10-CM | POA: Diagnosis not present

## 2017-03-14 DIAGNOSIS — E782 Mixed hyperlipidemia: Secondary | ICD-10-CM | POA: Diagnosis not present

## 2017-03-14 DIAGNOSIS — D369 Benign neoplasm, unspecified site: Secondary | ICD-10-CM | POA: Diagnosis not present

## 2017-03-14 DIAGNOSIS — F329 Major depressive disorder, single episode, unspecified: Secondary | ICD-10-CM

## 2017-03-14 DIAGNOSIS — Z0000 Encounter for general adult medical examination without abnormal findings: Secondary | ICD-10-CM | POA: Diagnosis not present

## 2017-03-14 DIAGNOSIS — Z23 Encounter for immunization: Secondary | ICD-10-CM

## 2017-03-14 DIAGNOSIS — Z8546 Personal history of malignant neoplasm of prostate: Secondary | ICD-10-CM

## 2017-03-14 DIAGNOSIS — F32A Depression, unspecified: Secondary | ICD-10-CM

## 2017-03-14 LAB — POCT URINALYSIS DIPSTICK
Blood, UA: NEGATIVE
Glucose, UA: NEGATIVE
Leukocytes, UA: NEGATIVE
Nitrite, UA: NEGATIVE
Protein, UA: NEGATIVE
Spec Grav, UA: 1.015 (ref 1.010–1.025)
Urobilinogen, UA: 0.2 E.U./dL
pH, UA: 6 (ref 5.0–8.0)

## 2017-03-14 MED ORDER — BUPROPION HCL ER (XL) 300 MG PO TB24: 300.0000 mg | ORAL_TABLET | Freq: Every day | ORAL | 3 refills | Status: DC

## 2017-03-14 MED ORDER — PROPRANOLOL HCL 20 MG PO TABS: ORAL_TABLET | ORAL | 3 refills | Status: DC

## 2017-03-14 MED ORDER — ATORVASTATIN CALCIUM 10 MG PO TABS: 10.0000 mg | ORAL_TABLET | Freq: Every day | ORAL | 3 refills | Status: DC

## 2017-03-14 MED ORDER — CLONAZEPAM 0.5 MG PO TABS: 0.5000 mg | ORAL_TABLET | Freq: Two times a day (BID) | ORAL | 3 refills | Status: DC | PRN

## 2017-03-24 DIAGNOSIS — N5231 Erectile dysfunction following radical prostatectomy: Secondary | ICD-10-CM | POA: Diagnosis not present

## 2017-03-24 DIAGNOSIS — N393 Stress incontinence (female) (male): Secondary | ICD-10-CM | POA: Diagnosis not present

## 2017-03-24 DIAGNOSIS — Z8546 Personal history of malignant neoplasm of prostate: Secondary | ICD-10-CM | POA: Diagnosis not present

## 2017-04-04 NOTE — Progress Notes (Signed)
Subjective:    Patient ID: Patrick Sanchez, male    DOB: 20-Feb-1961, 56 y.o.   MRN: [id number]  HPI 56 year old Male in today for health maintenance exam and evaluation of medical issues. Long-standing history of an inherited essential tremor for which he takes propranolol but has run out of that medication and we will start him back on it. History of mood disorder/anxiety/depression treated with Wellbutrin. History of erectile dysfunction status post robotic-assisted prostatectomy for prostate cancer in 2016 and takes when necessary Viagra or Cialis. History of gout for which he takes allopurinol. History of hyperlipidemia.  Apparently has not taken any medications in almost 2 weeks.  History of right frozen shoulder treated with injection by Dr. Mardelle Matte 2017.  History of large lipoma right upper arm evaluated at [facility]. Surgery not recommended.  Take statin medication for hyperlipidemia.  Social history: He is married. Has 2 daughters both living in [place]. Oldest daughter attends [employer] and the other daughter is working as a Equities trader. 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 He consumes alcohol daily. He does not smoke. History of DUI.  Family history: Mother died with pancreatic cancer. Father with history of diabetes and MI status post CABG. Apparently father was bedridden at the end of his life due to heart failure.  Patient is allergic to Sulfa.  History of allergic rhinitis. He was allergy tested by Dr. Donneta Romberg in the past and had reaction to perennial allergens. Allergy vaccinations were recommended but patient never pursued those. History of psoriasis. Right inguinal hernia repair 1972. Ganglion cyst removed from left wrist 1971. vasectomytomy 1999 by Dr. Joelyn Oms Thrombosed hemorrhoid incised and drained 1997. Right elbow epicondylitis 2003. Negative  Cardiolite study 2003 by Dr. Ron Parker.  Colonoscopy 2017 with 2 tubular adenomas removed    Review of Systems see above-Main complaint is worsening of tremor but is off medication      Objective:   Physical Exam  Constitutional: He is oriented to person, place, and time. He appears well-developed and well-nourished. No distress.  HENT:  Head: Normocephalic and atraumatic.  Right Ear: External ear normal.  Left Ear: External ear normal.  Mouth/Throat: Oropharynx is clear and moist. No oropharyngeal exudate.  Eyes: Conjunctivae and EOM are normal. Pupils are equal, round, and reactive to light. Right eye exhibits no discharge. Left eye exhibits no discharge. No scleral icterus.  Neck: No JVD present. No thyromegaly present.  Cardiovascular: Normal rate, regular rhythm and normal heart sounds.   No murmur heard. Pulmonary/Chest: Effort normal and breath sounds normal. No respiratory distress. He has no wheezes.  Abdominal: Soft. Bowel sounds are normal. He exhibits no distension and no mass. There is no tenderness. There is no rebound and no guarding.  Genitourinary:  Genitourinary Comments: Deferred prostate exam  Neurological: He is alert and oriented to person, place, and time. He has normal reflexes. No cranial nerve deficit.  Essential tremor  Skin: Skin is warm and dry. He is not diaphoretic.  Psychiatric: He has a normal mood and affect. His behavior is normal. Judgment and thought content normal.  Vitals reviewed.         Assessment & Plan:   History of prostate cancer followed by urologist  Hyperlipidemia-not taken medications in 2 weeks. Total cholesterol 251 and triglycerides 334. LDL cholesterol 124. Needs follow-up in 6 months after he gets back on medication.  Anxiety depression treated with Klonopin sparingly and Wellbutrin daily. Restart these medications.  Essential tremor-prescribed propranolol  Allergic rhinitis  History of gout treated with  allopurinol  Plan: Recommend returning in 6 months for follow-up with lipids

## 2017-04-04 NOTE — Patient Instructions (Signed)
It was pleasure to see you today. However please restart her medications and follow-up in 6 months. Please be compliant with her medications. See urologist as recommended.

## 2017-06-30 DIAGNOSIS — Z8546 Personal history of malignant neoplasm of prostate: Secondary | ICD-10-CM | POA: Diagnosis not present

## 2017-08-19 ENCOUNTER — Ambulatory Visit (INDEPENDENT_AMBULATORY_CARE_PROVIDER_SITE_OTHER): Payer: BLUE CROSS/BLUE SHIELD | Admitting: Internal Medicine

## 2017-08-19 ENCOUNTER — Encounter: Payer: Self-pay | Admitting: Internal Medicine

## 2017-08-19 VITALS — BP 110/60 | HR 95 | Temp 97.5°F | Wt 156.0 lb

## 2017-08-19 DIAGNOSIS — M545 Low back pain, unspecified: Secondary | ICD-10-CM

## 2017-08-19 MED ORDER — CYCLOBENZAPRINE HCL 10 MG PO TABS: 10.0000 mg | ORAL_TABLET | Freq: Every day | ORAL | 0 refills | Status: DC

## 2017-08-19 MED ORDER — HYDROCODONE-ACETAMINOPHEN 10-325 MG PO TABS: 1.0000 | ORAL_TABLET | Freq: Three times a day (TID) | ORAL | 0 refills | Status: DC | PRN

## 2017-08-20 NOTE — Progress Notes (Signed)
   Subjective:    Patient ID: Patrick Sanchez, male    DOB: 02-09-1961, 56 y.o.   MRN: [id number]  HPI Patient in today with acute back pain.  Has had low back pain for several days and cannot get any relief.  No significant radiation into legs.  Does not recall any heavy lifting.  Currently working as a Cabin crew.  Business has been slow for him with this job.  It has been stressful.  Says children are doing well.  His tremor seems more prominent today.  Has been taking Advil for back pain.  Would like to have muscle relaxant and pain medication.    Review of Systems see above    Objective:   Physical Exam Straight leg raising is negative at 90 degrees bilaterally.  Muscle strength is normal.  Deep tendon reflexes 2+ and symmetrical in the knees.  Range of motion in the trunk is decreased       Assessment & Plan:  Acute low back pain  Plan: Flexeril 10 mg 1 p.o. nightly.  Hydrocodone APAP 10/325 1 p.o. every 8 hours as needed pain #20 with no refill.  Patient understands we can only provide short course of hydrocodone for back pain due to new prescribing regulations.

## 2017-08-31 NOTE — Patient Instructions (Signed)
Flexeril 10 mg at bedtime as muscle relaxant.  Hydrocodone APAP 10/325 sparingly every 8 hours as needed severe back pain.

## 2017-09-10 ENCOUNTER — Other Ambulatory Visit: Payer: Self-pay | Admitting: Internal Medicine

## 2017-09-10 DIAGNOSIS — E785 Hyperlipidemia, unspecified: Secondary | ICD-10-CM

## 2017-09-10 DIAGNOSIS — Z79899 Other long term (current) drug therapy: Secondary | ICD-10-CM

## 2017-09-12 ENCOUNTER — Other Ambulatory Visit: Payer: BLUE CROSS/BLUE SHIELD | Admitting: Internal Medicine

## 2017-09-18 ENCOUNTER — Telehealth: Payer: Self-pay | Admitting: Internal Medicine

## 2017-09-18 ENCOUNTER — Encounter: Payer: Self-pay | Admitting: Internal Medicine

## 2017-09-18 ENCOUNTER — Other Ambulatory Visit: Payer: BLUE CROSS/BLUE SHIELD | Admitting: Internal Medicine

## 2017-09-18 NOTE — Telephone Encounter (Signed)
Says he did not realize he had appt this am for lipid/liver panels. Says he prefers to wait now until after the holidays and will call back.

## 2017-09-24 ENCOUNTER — Other Ambulatory Visit: Payer: Self-pay | Admitting: Internal Medicine

## 2017-09-24 DIAGNOSIS — E785 Hyperlipidemia, unspecified: Secondary | ICD-10-CM

## 2017-09-24 DIAGNOSIS — Z79899 Other long term (current) drug therapy: Secondary | ICD-10-CM

## 2017-09-26 ENCOUNTER — Other Ambulatory Visit: Payer: Self-pay | Admitting: Internal Medicine

## 2017-09-26 ENCOUNTER — Other Ambulatory Visit: Payer: BLUE CROSS/BLUE SHIELD | Admitting: Internal Medicine

## 2017-09-26 DIAGNOSIS — E785 Hyperlipidemia, unspecified: Secondary | ICD-10-CM

## 2017-09-26 DIAGNOSIS — Z79899 Other long term (current) drug therapy: Secondary | ICD-10-CM

## 2017-09-26 NOTE — Addendum Note (Signed)
Addended by: Mady Haagensen on: 09/26/2017 11:59 AM   Modules accepted: Orders

## 2017-10-08 DIAGNOSIS — Z8546 Personal history of malignant neoplasm of prostate: Secondary | ICD-10-CM | POA: Diagnosis not present

## 2017-10-16 DIAGNOSIS — Z8546 Personal history of malignant neoplasm of prostate: Secondary | ICD-10-CM | POA: Diagnosis not present

## 2017-10-16 DIAGNOSIS — N5231 Erectile dysfunction following radical prostatectomy: Secondary | ICD-10-CM | POA: Diagnosis not present

## 2017-10-16 DIAGNOSIS — N393 Stress incontinence (female) (male): Secondary | ICD-10-CM | POA: Diagnosis not present

## 2018-01-05 ENCOUNTER — Encounter: Payer: Self-pay | Admitting: Internal Medicine

## 2018-01-05 ENCOUNTER — Ambulatory Visit: Payer: BLUE CROSS/BLUE SHIELD | Admitting: Internal Medicine

## 2018-01-05 VITALS — BP 120/88 | HR 80 | Temp 98.1°F | Ht 67.0 in

## 2018-01-05 DIAGNOSIS — S0990XA Unspecified injury of head, initial encounter: Secondary | ICD-10-CM

## 2018-01-05 DIAGNOSIS — H579 Unspecified disorder of eye and adnexa: Secondary | ICD-10-CM | POA: Diagnosis not present

## 2018-01-05 DIAGNOSIS — H5500 Unspecified nystagmus: Secondary | ICD-10-CM

## 2018-01-05 DIAGNOSIS — G250 Essential tremor: Secondary | ICD-10-CM

## 2018-01-05 NOTE — Patient Instructions (Addendum)
Labs drawn and pending. Suggest CT of brain.  Further instructions pending lab results.

## 2018-01-05 NOTE — Progress Notes (Signed)
   Subjective:    Patient ID: Patrick Sanchez, male    DOB: Aug 16, 1961, 57 y.o.   MRN: [id number]  HPI 57 year old Male with history of essential tremor in today with complaint of what sounds like nystagmus.  Says he has had several episodes over the past few weeks of his eyes beating very fast in one direction.  A month or so ago he apparently fell and struck the occipital area of his head.  Admits he is drinking too much alcohol at least 5 drinks a night.  He is tremulous in the office today.  He denies headache  He is concerned he may have vertigo based on what he is read on the Internet.  He has a history of hyperlipidemia treated with statins  He has a history of anxiety depression treated with Wellbutrin  Status post robotic assisted prostatectomy for prostate cancer in 2016  He is allergic to Sulfa  History of gout.  History of allergic rhinitis and he feels that recently he has had nasal congestion.  See dictation from March 14, 2017 at time of physical examination  History of 2  tubular adenoma was removed during colonoscopy in 2017  Review of Systems     Objective:   Physical Exam He has no depressions or noticeable injuries of his occipital scalp area.  PERRLA LA.  He seems to have difficulty following my finger for extraocular movement testing.  I cannot clearly demonstrate nystagmus today.  Blood pressure lying is 120/90, sitting blood pressure 130/100 and standing blood pressure 142/100 therefore indicating he does not have orthostasis.  Cardiac exam is normal.  Chest clear.  Aside from the tremor I do not see any gross focal deficits       Assessment & Plan:  Recent history of head trauma but no loss of consciousness  Bilateral hand tremor  Alcohol abuse  History of apparent nystagmus but cannot demonstrate today on exam.  Patient having trouble following finger for extraocular movement testing for some reason today.  Plan: Would like to get a CT of his  head for further evaluation.  He does have a history of allergic rhinitis and may have some sinus issues.  He does have history of head trauma without loss of consciousness.  Labs drawn and pending including CBC, C met, TSH, lipid panel B12 folate levels and ethanol level

## 2018-01-06 NOTE — Addendum Note (Signed)
Addended by: Mady Haagensen on: 01/06/2018 03:58 PM   Modules accepted: Orders

## 2018-01-07 LAB — CBC WITH DIFFERENTIAL/PLATELET
Basophils Absolute: 112 cells/uL (ref 0–200)
Basophils Relative: 1.4 %
Eosinophils Absolute: 200 cells/uL (ref 15–500)
Eosinophils Relative: 2.5 %
HCT: 48.5 % (ref 38.5–50.0)
Hemoglobin: 16.8 g/dL (ref 13.2–17.1)
Lymphs Abs: 1696 cells/uL (ref 850–3900)
MCH: 32.4 pg (ref 27.0–33.0)
MCHC: 34.6 g/dL (ref 32.0–36.0)
MCV: 93.4 fL (ref 80.0–100.0)
MPV: 13.1 fL — ABNORMAL HIGH (ref 7.5–12.5)
Monocytes Relative: 8 %
Neutro Abs: 5352 cells/uL (ref 1500–7800)
Neutrophils Relative %: 66.9 %
Platelets: 206 10*3/uL (ref 140–400)
RBC: 5.19 10*6/uL (ref 4.20–5.80)
RDW: 12.4 % (ref 11.0–15.0)
Total Lymphocyte: 21.2 %
WBC mixed population: 640 cells/uL (ref 200–950)
WBC: 8 10*3/uL (ref 3.8–10.8)

## 2018-01-07 LAB — COMPLETE METABOLIC PANEL WITH GFR
AG Ratio: 2 (calc) (ref 1.0–2.5)
ALT: 26 U/L (ref 9–46)
AST: 18 U/L (ref 10–35)
Albumin: 4.5 g/dL (ref 3.6–5.1)
Alkaline phosphatase (APISO): 70 U/L (ref 40–115)
BUN: 20 mg/dL (ref 7–25)
CO2: 26 mmol/L (ref 20–32)
Calcium: 9.5 mg/dL (ref 8.6–10.3)
Chloride: 103 mmol/L (ref 98–110)
Creat: 0.97 mg/dL (ref 0.70–1.33)
GFR, Est African American: 101 mL/min/{1.73_m2} (ref >=60)
GFR, Est Non African American: 87 mL/min/{1.73_m2} (ref >=60)
Globulin: 2.3 g/dL (calc) (ref 1.9–3.7)
Glucose, Bld: 113 mg/dL (ref 65–139)
Potassium: 4.5 mmol/L (ref 3.5–5.3)
Sodium: 139 mmol/L (ref 135–146)
Total Bilirubin: 0.7 mg/dL (ref 0.2–1.2)
Total Protein: 6.8 g/dL (ref 6.1–8.1)

## 2018-01-07 LAB — LIPID PANEL
Cholesterol: 255 mg/dL — ABNORMAL HIGH (ref <200)
HDL: 61 mg/dL (ref >40)
LDL Cholesterol (Calc): 156 mg/dL (calc) — ABNORMAL HIGH
Non-HDL Cholesterol (Calc): 194 mg/dL (calc) — ABNORMAL HIGH (ref <130)
Total CHOL/HDL Ratio: 4.2 (calc) (ref <5.0)
Triglycerides: 215 mg/dL — ABNORMAL HIGH (ref <150)

## 2018-01-07 LAB — ETHANOL, CLINICAL
Alcohol, Ethyl (B): NOT DETECTED g/dL(%)
Alcohol, Ethyl (B): NOT DETECTED mg/dL

## 2018-01-07 LAB — FOLATE: Folate: 15.2 ng/mL

## 2018-01-07 LAB — VITAMIN B12: Vitamin B-12: 362 pg/mL (ref 200–1100)

## 2018-01-07 LAB — TSH: TSH: 1.52 mIU/L (ref 0.40–4.50)

## 2018-01-09 ENCOUNTER — Telehealth: Payer: Self-pay | Admitting: Internal Medicine

## 2018-01-09 NOTE — Telephone Encounter (Signed)
Ena Dawley from [facility] called stating that the patient is scheduled for a CT of the head with and without contrast on Monday, 01/12/2018 at 2:00 pm at the [facility] location. They are needing a Prior Authorization completed for this procedure through [facility].  Please advise.

## 2018-01-12 ENCOUNTER — Other Ambulatory Visit: Payer: BLUE CROSS/BLUE SHIELD

## 2018-01-12 NOTE — Telephone Encounter (Signed)
Spoke with Fraser Din @ [facility] and auth # Obtained and placed auth # in referrals portion of Epic.  Called Noelle at [facility] and LM for her.  Patient to have CT this afternoon at 2pm.

## 2018-01-15 DIAGNOSIS — Z8546 Personal history of malignant neoplasm of prostate: Secondary | ICD-10-CM | POA: Diagnosis not present

## 2018-03-11 ENCOUNTER — Other Ambulatory Visit: Payer: Self-pay | Admitting: Internal Medicine

## 2018-04-01 DIAGNOSIS — R102 Pelvic and perineal pain: Secondary | ICD-10-CM | POA: Diagnosis not present

## 2018-04-01 DIAGNOSIS — N393 Stress incontinence (female) (male): Secondary | ICD-10-CM | POA: Diagnosis not present

## 2018-04-01 DIAGNOSIS — M62838 Other muscle spasm: Secondary | ICD-10-CM | POA: Diagnosis not present

## 2018-04-01 DIAGNOSIS — M6281 Muscle weakness (generalized): Secondary | ICD-10-CM | POA: Diagnosis not present

## 2018-04-10 DIAGNOSIS — Z8546 Personal history of malignant neoplasm of prostate: Secondary | ICD-10-CM | POA: Diagnosis not present

## 2018-04-14 DIAGNOSIS — M6281 Muscle weakness (generalized): Secondary | ICD-10-CM | POA: Diagnosis not present

## 2018-04-14 DIAGNOSIS — R300 Dysuria: Secondary | ICD-10-CM | POA: Diagnosis not present

## 2018-04-14 DIAGNOSIS — M62838 Other muscle spasm: Secondary | ICD-10-CM | POA: Diagnosis not present

## 2018-04-14 DIAGNOSIS — N3946 Mixed incontinence: Secondary | ICD-10-CM | POA: Diagnosis not present

## 2018-04-16 DIAGNOSIS — N5231 Erectile dysfunction following radical prostatectomy: Secondary | ICD-10-CM | POA: Diagnosis not present

## 2018-04-16 DIAGNOSIS — N3946 Mixed incontinence: Secondary | ICD-10-CM | POA: Diagnosis not present

## 2018-04-16 DIAGNOSIS — Z8546 Personal history of malignant neoplasm of prostate: Secondary | ICD-10-CM | POA: Diagnosis not present

## 2018-04-29 DIAGNOSIS — M62838 Other muscle spasm: Secondary | ICD-10-CM | POA: Diagnosis not present

## 2018-04-29 DIAGNOSIS — R300 Dysuria: Secondary | ICD-10-CM | POA: Diagnosis not present

## 2018-04-29 DIAGNOSIS — M6281 Muscle weakness (generalized): Secondary | ICD-10-CM | POA: Diagnosis not present

## 2018-04-29 DIAGNOSIS — N3946 Mixed incontinence: Secondary | ICD-10-CM | POA: Diagnosis not present

## 2018-05-13 DIAGNOSIS — N393 Stress incontinence (female) (male): Secondary | ICD-10-CM | POA: Diagnosis not present

## 2018-05-13 DIAGNOSIS — M62838 Other muscle spasm: Secondary | ICD-10-CM | POA: Diagnosis not present

## 2018-05-13 DIAGNOSIS — M6281 Muscle weakness (generalized): Secondary | ICD-10-CM | POA: Diagnosis not present

## 2018-05-13 DIAGNOSIS — R300 Dysuria: Secondary | ICD-10-CM | POA: Diagnosis not present

## 2018-05-17 ENCOUNTER — Other Ambulatory Visit: Payer: Self-pay | Admitting: Internal Medicine

## 2018-05-28 DIAGNOSIS — M62838 Other muscle spasm: Secondary | ICD-10-CM | POA: Diagnosis not present

## 2018-05-28 DIAGNOSIS — R300 Dysuria: Secondary | ICD-10-CM | POA: Diagnosis not present

## 2018-05-28 DIAGNOSIS — N393 Stress incontinence (female) (male): Secondary | ICD-10-CM | POA: Diagnosis not present

## 2018-05-28 DIAGNOSIS — M6281 Muscle weakness (generalized): Secondary | ICD-10-CM | POA: Diagnosis not present

## 2018-06-12 ENCOUNTER — Other Ambulatory Visit: Payer: Self-pay | Admitting: Internal Medicine

## 2018-06-15 DIAGNOSIS — M62838 Other muscle spasm: Secondary | ICD-10-CM | POA: Diagnosis not present

## 2018-06-15 DIAGNOSIS — R300 Dysuria: Secondary | ICD-10-CM | POA: Diagnosis not present

## 2018-06-15 DIAGNOSIS — N393 Stress incontinence (female) (male): Secondary | ICD-10-CM | POA: Diagnosis not present

## 2018-06-15 DIAGNOSIS — M6281 Muscle weakness (generalized): Secondary | ICD-10-CM | POA: Diagnosis not present

## 2018-06-22 ENCOUNTER — Other Ambulatory Visit: Payer: Self-pay | Admitting: Internal Medicine

## 2018-06-22 DIAGNOSIS — F32A Depression, unspecified: Secondary | ICD-10-CM

## 2018-06-22 DIAGNOSIS — E782 Mixed hyperlipidemia: Secondary | ICD-10-CM

## 2018-06-22 DIAGNOSIS — F419 Anxiety disorder, unspecified: Secondary | ICD-10-CM

## 2018-06-22 DIAGNOSIS — Z8739 Personal history of other diseases of the musculoskeletal system and connective tissue: Secondary | ICD-10-CM

## 2018-06-22 DIAGNOSIS — N529 Male erectile dysfunction, unspecified: Secondary | ICD-10-CM

## 2018-06-22 DIAGNOSIS — G250 Essential tremor: Secondary | ICD-10-CM

## 2018-06-22 DIAGNOSIS — Z8546 Personal history of malignant neoplasm of prostate: Secondary | ICD-10-CM

## 2018-06-22 DIAGNOSIS — D369 Benign neoplasm, unspecified site: Secondary | ICD-10-CM

## 2018-06-22 DIAGNOSIS — Z0000 Encounter for general adult medical examination without abnormal findings: Secondary | ICD-10-CM

## 2018-06-22 DIAGNOSIS — F329 Major depressive disorder, single episode, unspecified: Secondary | ICD-10-CM

## 2018-06-23 ENCOUNTER — Other Ambulatory Visit: Payer: BLUE CROSS/BLUE SHIELD | Admitting: Internal Medicine

## 2018-06-23 DIAGNOSIS — N529 Male erectile dysfunction, unspecified: Secondary | ICD-10-CM

## 2018-06-23 DIAGNOSIS — Z0000 Encounter for general adult medical examination without abnormal findings: Secondary | ICD-10-CM

## 2018-06-23 DIAGNOSIS — F419 Anxiety disorder, unspecified: Secondary | ICD-10-CM

## 2018-06-23 DIAGNOSIS — F32A Depression, unspecified: Secondary | ICD-10-CM

## 2018-06-23 DIAGNOSIS — E782 Mixed hyperlipidemia: Secondary | ICD-10-CM | POA: Diagnosis not present

## 2018-06-23 DIAGNOSIS — Z8739 Personal history of other diseases of the musculoskeletal system and connective tissue: Secondary | ICD-10-CM

## 2018-06-23 DIAGNOSIS — F329 Major depressive disorder, single episode, unspecified: Secondary | ICD-10-CM | POA: Diagnosis not present

## 2018-06-23 DIAGNOSIS — Z8546 Personal history of malignant neoplasm of prostate: Secondary | ICD-10-CM | POA: Diagnosis not present

## 2018-06-23 DIAGNOSIS — D369 Benign neoplasm, unspecified site: Secondary | ICD-10-CM

## 2018-06-23 DIAGNOSIS — G250 Essential tremor: Secondary | ICD-10-CM

## 2018-06-23 LAB — CBC WITH DIFFERENTIAL/PLATELET
Basophils Absolute: 133 cells/uL (ref 0–200)
Basophils Relative: 1.6 %
Eosinophils Absolute: 349 cells/uL (ref 15–500)
Eosinophils Relative: 4.2 %
HCT: 45.9 % (ref 38.5–50.0)
Hemoglobin: 15.8 g/dL (ref 13.2–17.1)
Lymphs Abs: 1760 cells/uL (ref 850–3900)
MCH: 32.8 pg (ref 27.0–33.0)
MCHC: 34.4 g/dL (ref 32.0–36.0)
MCV: 95.4 fL (ref 80.0–100.0)
MPV: 13.3 fL — ABNORMAL HIGH (ref 7.5–12.5)
Monocytes Relative: 8.6 %
Neutro Abs: 5345 cells/uL (ref 1500–7800)
Neutrophils Relative %: 64.4 %
Platelets: 158 10*3/uL (ref 140–400)
RBC: 4.81 10*6/uL (ref 4.20–5.80)
RDW: 12.3 % (ref 11.0–15.0)
Total Lymphocyte: 21.2 %
WBC mixed population: 714 cells/uL (ref 200–950)
WBC: 8.3 10*3/uL (ref 3.8–10.8)

## 2018-06-23 LAB — COMPLETE METABOLIC PANEL WITH GFR
AG Ratio: 1.9 (calc) (ref 1.0–2.5)
ALT: 32 U/L (ref 9–46)
AST: 25 U/L (ref 10–35)
Albumin: 4.2 g/dL (ref 3.6–5.1)
Alkaline phosphatase (APISO): 76 U/L (ref 40–115)
BUN: 19 mg/dL (ref 7–25)
CO2: 25 mmol/L (ref 20–32)
Calcium: 9.2 mg/dL (ref 8.6–10.3)
Chloride: 100 mmol/L (ref 98–110)
Creat: 0.97 mg/dL (ref 0.70–1.33)
GFR, Est African American: 100 mL/min/{1.73_m2} (ref >=60)
GFR, Est Non African American: 86 mL/min/{1.73_m2} (ref >=60)
Globulin: 2.2 g/dL (calc) (ref 1.9–3.7)
Glucose, Bld: 90 mg/dL (ref 65–99)
Potassium: 3.9 mmol/L (ref 3.5–5.3)
Sodium: 136 mmol/L (ref 135–146)
Total Bilirubin: 0.7 mg/dL (ref 0.2–1.2)
Total Protein: 6.4 g/dL (ref 6.1–8.1)

## 2018-06-23 LAB — LIPID PANEL
Cholesterol: 187 mg/dL (ref <200)
HDL: 63 mg/dL (ref >40)
LDL Cholesterol (Calc): 95 mg/dL (calc)
Non-HDL Cholesterol (Calc): 124 mg/dL (calc) (ref <130)
Total CHOL/HDL Ratio: 3 (calc) (ref <5.0)
Triglycerides: 197 mg/dL — ABNORMAL HIGH (ref <150)

## 2018-06-23 LAB — PSA: PSA: <0.1 ng/mL (ref <=4.0)

## 2018-06-25 ENCOUNTER — Ambulatory Visit (INDEPENDENT_AMBULATORY_CARE_PROVIDER_SITE_OTHER): Payer: BLUE CROSS/BLUE SHIELD | Admitting: Internal Medicine

## 2018-06-25 ENCOUNTER — Encounter: Payer: Self-pay | Admitting: Internal Medicine

## 2018-06-25 VITALS — BP 120/80 | HR 82 | Temp 98.2°F | Ht 67.0 in | Wt 151.0 lb

## 2018-06-25 DIAGNOSIS — Z8546 Personal history of malignant neoplasm of prostate: Secondary | ICD-10-CM | POA: Diagnosis not present

## 2018-06-25 DIAGNOSIS — Z0000 Encounter for general adult medical examination without abnormal findings: Secondary | ICD-10-CM

## 2018-06-25 DIAGNOSIS — F329 Major depressive disorder, single episode, unspecified: Secondary | ICD-10-CM

## 2018-06-25 DIAGNOSIS — E782 Mixed hyperlipidemia: Secondary | ICD-10-CM

## 2018-06-25 DIAGNOSIS — F419 Anxiety disorder, unspecified: Secondary | ICD-10-CM

## 2018-06-25 DIAGNOSIS — Z8739 Personal history of other diseases of the musculoskeletal system and connective tissue: Secondary | ICD-10-CM

## 2018-06-25 DIAGNOSIS — Z23 Encounter for immunization: Secondary | ICD-10-CM | POA: Diagnosis not present

## 2018-06-25 DIAGNOSIS — F439 Reaction to severe stress, unspecified: Secondary | ICD-10-CM

## 2018-06-25 DIAGNOSIS — F32A Depression, unspecified: Secondary | ICD-10-CM

## 2018-06-25 DIAGNOSIS — G250 Essential tremor: Secondary | ICD-10-CM

## 2018-06-25 DIAGNOSIS — N529 Male erectile dysfunction, unspecified: Secondary | ICD-10-CM | POA: Diagnosis not present

## 2018-06-25 DIAGNOSIS — D369 Benign neoplasm, unspecified site: Secondary | ICD-10-CM

## 2018-06-25 LAB — POCT URINALYSIS DIPSTICK
Appearance: NEGATIVE
Bilirubin, UA: NEGATIVE
Blood, UA: NEGATIVE
Glucose, UA: NEGATIVE
Ketones, UA: NEGATIVE
Leukocytes, UA: NEGATIVE
Nitrite, UA: NEGATIVE
Odor: NEGATIVE
Protein, UA: NEGATIVE
Spec Grav, UA: 1.015 (ref 1.010–1.025)
Urobilinogen, UA: 0.2 E.U./dL
pH, UA: 6 (ref 5.0–8.0)

## 2018-06-25 MED ORDER — FLUOCINONIDE 0.05 % EX OINT: 1.0000 "application " | TOPICAL_OINTMENT | CUTANEOUS | 11 refills | Status: DC | PRN

## 2018-06-28 NOTE — Progress Notes (Signed)
Subjective:    Patient ID: Patrick Sanchez, male    DOB: 02/12/1961, 57 y.o.   MRN: [id number]  HPI 57 year old Male for health maintenance exam and evaluation of medical issues.  Long-standing history of essential tremor for which he takes propranolol.  History of mood disorder, anxiety and depression treated with Wellbutrin.  History of erectile dysfunction status post robotic assisted prostatectomy for prostate cancer 2016 and takes as needed Viagra or Cialis.  History of gout for which he takes allopurinol.  History of hyperlipidemia.  History of right frozen shoulder treated by injection by Dr. Mardelle Matte 2017.  History of large lipoma right upper arm evaluated at [facility] in the past and surgery not recommended.  Take statin medication for hyperlipidemia  Social history: He is married.  He has 2 daughters both living in [place].  Oldest daughter attends college and the other daughter is working as a Development worker, community.  He formally worked as a Copywriter, advertising in an Advice worker.  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  He spends a lot of time at home.  He and his wife are having some financial issues.  He consumes alcohol daily.  He does not smoke.  Family history: Mother died with pancreatic cancer.  Father with history of diabetes and MI status post CABG.  Apparently father was bedridden at the end of his life due to heart failure.  Patient is allergic to sulfa.  History of allergic rhinitis.  He was allergy tested by Dr. Donneta Romberg in the past and had reaction to perennial allergens.  Allergy vaccines were recommended but patient never pursued that.  History of psoriasis.  Right inguinal hernia repair 1972.  Ganglion cyst removed from left wrist 1971.  Vasectomy 1999 by Dr. Joelyn Oms.  Thrombosed hemorrhoid incised and drained 1997.  Right elbow epicondylitis 2003.  Negative Cardiolite study 2003 by Dr. Kae Heller.  Colonoscopy  2017 with 2 tubular adenomas removed.    Review of Systems admits to being depressed over job situation and some property issues with neighbors     Objective:   Physical Exam  Constitutional: He is oriented to person, place, and time. He appears well-developed and well-nourished.  HENT:  Head: Normocephalic and atraumatic.  Right Ear: External ear normal.  Left Ear: External ear normal.  Mouth/Throat: Oropharynx is clear and moist. No oropharyngeal exudate.  Eyes: Pupils are equal, round, and reactive to light. Conjunctivae and EOM are normal. Right eye exhibits no discharge. Left eye exhibits no discharge. No scleral icterus.  Neck: Neck supple. No JVD present. No thyromegaly present.  Cardiovascular: Normal rate, regular rhythm and normal heart sounds.  No murmur heard. Pulmonary/Chest: Effort normal and breath sounds normal. No respiratory distress. He has no wheezes. He has no rales.  Abdominal: Soft. Bowel sounds are normal. He exhibits no distension and no mass. There is no tenderness. There is no rebound and no guarding.  Genitourinary:  Genitourinary Comments: Deferred status post prostatectomy  Musculoskeletal: He exhibits no edema.  Lipoma right upper arm unchanged  Lymphadenopathy:    He has no cervical adenopathy.  Neurological: He is alert and oriented to person, place, and time. He displays normal reflexes. No cranial nerve deficit. He exhibits normal muscle tone. Coordination normal.  Essential tremor seems unchanged from previous year  Skin: Skin is warm and dry.  Vitals reviewed.         Assessment &  Plan:  History of prostate cancer.  PSA less than 0.1  Hyperlipidemia-triglycerides elevated at 197-discussed diet and exercise.  Patient is on statin medication  Essential tremor treated with propranolol  Anxiety depression-patient is on Wellbutrin  Eczema right arm-Lidex ointment prescribed to use sparingly  Impotence after prostatectomy-okay to refill  Viagra  History of gout treated with allopurinol  Health maintenance-flu vaccine given  Situational stress with job situation and property issues with neighbors  Plan: Return in 1 year or as needed.  Continue same medications as previously prescribed

## 2018-06-30 DIAGNOSIS — M62838 Other muscle spasm: Secondary | ICD-10-CM | POA: Diagnosis not present

## 2018-06-30 DIAGNOSIS — R102 Pelvic and perineal pain: Secondary | ICD-10-CM | POA: Diagnosis not present

## 2018-06-30 DIAGNOSIS — M6281 Muscle weakness (generalized): Secondary | ICD-10-CM | POA: Diagnosis not present

## 2018-06-30 DIAGNOSIS — N3946 Mixed incontinence: Secondary | ICD-10-CM | POA: Diagnosis not present

## 2018-07-01 NOTE — Patient Instructions (Signed)
It was a pleasure to see you today.  Continue same medications and return in 1 year or as needed.  Flu vaccine given today.  Watch diet.  Try to get some exercise.

## 2018-07-13 DIAGNOSIS — M6281 Muscle weakness (generalized): Secondary | ICD-10-CM | POA: Diagnosis not present

## 2018-07-13 DIAGNOSIS — R102 Pelvic and perineal pain: Secondary | ICD-10-CM | POA: Diagnosis not present

## 2018-07-13 DIAGNOSIS — M62838 Other muscle spasm: Secondary | ICD-10-CM | POA: Diagnosis not present

## 2018-07-13 DIAGNOSIS — N393 Stress incontinence (female) (male): Secondary | ICD-10-CM | POA: Diagnosis not present

## 2018-11-15 ENCOUNTER — Other Ambulatory Visit: Payer: Self-pay | Admitting: Internal Medicine

## 2018-11-17 ENCOUNTER — Ambulatory Visit (INDEPENDENT_AMBULATORY_CARE_PROVIDER_SITE_OTHER): Payer: BLUE CROSS/BLUE SHIELD | Admitting: Internal Medicine

## 2018-11-17 ENCOUNTER — Encounter: Payer: Self-pay | Admitting: Internal Medicine

## 2018-11-17 VITALS — BP 102/70 | HR 81 | Ht 67.0 in | Wt 160.0 lb

## 2018-11-17 DIAGNOSIS — F439 Reaction to severe stress, unspecified: Secondary | ICD-10-CM | POA: Diagnosis not present

## 2018-11-17 DIAGNOSIS — F419 Anxiety disorder, unspecified: Secondary | ICD-10-CM | POA: Diagnosis not present

## 2018-11-17 DIAGNOSIS — F329 Major depressive disorder, single episode, unspecified: Secondary | ICD-10-CM

## 2018-11-17 DIAGNOSIS — F32A Depression, unspecified: Secondary | ICD-10-CM

## 2018-11-17 MED ORDER — SERTRALINE HCL 100 MG PO TABS: 100.0000 mg | ORAL_TABLET | Freq: Every day | ORAL | 3 refills | Status: DC

## 2018-11-17 NOTE — Progress Notes (Signed)
   Subjective:    Patient ID: Patrick Sanchez, male    DOB: Oct 30, 1960, 58 y.o.   MRN: [id number]  HPI 58 year old Male in today to discuss depression.  He became a licensed Forensic psychologist.  He found it difficult to secure listings.  Subsequently has given that up and basically has just been staying at home with his wife who is also unemployed.  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  Daughters are living in [place].  One is working as a Art therapist and the other one attends [employer].  Patient and his wife have had some issues with their neighbors recently.  They have also had some issues with the IRS and financial difficulties.  He is finding it hard to be motivated to seek work.  We talked about going to counseling.  He was given some names to consider.  He has been on Wellbutrin for a number of years currently 300 300 mg XL daily.  I am going to add Zoloft 100 mg daily and asked that he follow-up in a few weeks at least with a telephone call.    Review of Systems see above-probably sleeping too much.  Discussed alcohol consumption.     Objective:   Physical Exam He was not examined today but spent 15 minutes speaking with him about these issues and difficulties.       Assessment & Plan:  Anxiety and depression  Situational stress   Plan: Continue Wellbutrin 300 mg XL daily.  Add Zoloft 100 mg daily starting initially with half tablet and working up to a whole tablet over 2 weeks.  At least telephone [place] within a few weeks and let us know how these medications are working.  Recommend counseling.

## 2018-12-04 NOTE — Patient Instructions (Signed)
Continue Wellbutrin XL 300 mg daily.  Add Zoloft 100 mg daily.  Telephone message in a few weeks and let us know how these meds are working for you.  Counseling recommended.

## 2018-12-10 ENCOUNTER — Other Ambulatory Visit: Payer: Self-pay

## 2018-12-10 MED ORDER — SERTRALINE HCL 100 MG PO TABS: 100.0000 mg | ORAL_TABLET | Freq: Every day | ORAL | 11 refills | Status: DC

## 2018-12-17 ENCOUNTER — Encounter: Payer: Self-pay | Admitting: Internal Medicine

## 2018-12-17 ENCOUNTER — Ambulatory Visit (INDEPENDENT_AMBULATORY_CARE_PROVIDER_SITE_OTHER): Payer: BLUE CROSS/BLUE SHIELD | Admitting: Internal Medicine

## 2018-12-17 ENCOUNTER — Other Ambulatory Visit: Payer: Self-pay

## 2018-12-17 VITALS — BP 130/88 | HR 85 | Ht 67.0 in | Wt 154.0 lb

## 2018-12-17 DIAGNOSIS — J22 Unspecified acute lower respiratory infection: Secondary | ICD-10-CM | POA: Diagnosis not present

## 2018-12-17 DIAGNOSIS — F419 Anxiety disorder, unspecified: Secondary | ICD-10-CM

## 2018-12-17 DIAGNOSIS — F329 Major depressive disorder, single episode, unspecified: Secondary | ICD-10-CM

## 2018-12-17 DIAGNOSIS — F439 Reaction to severe stress, unspecified: Secondary | ICD-10-CM

## 2018-12-17 DIAGNOSIS — F32A Depression, unspecified: Secondary | ICD-10-CM

## 2018-12-17 MED ORDER — LEVOFLOXACIN 500 MG PO TABS: 500.0000 mg | ORAL_TABLET | Freq: Every day | ORAL | 0 refills | Status: DC

## 2018-12-17 NOTE — Progress Notes (Signed)
   Subjective:    Patient ID: Patrick Sanchez, male    DOB: [birth year], 58 y.o.   MRN: [id number]  HPI Here today to follow-up on anxiety depression and situational stress.  Still staying at home a lot.  Has not really tried to find employment.  Has not sought counseling.  At last visit in February, we added Zoloft 100 mg daily to Wellbutrin 300 mg daily.  He thinks it has helped some but he still depressed.  His 2 daughters are living in [place] and seem to be doing okay.  This is not a concern with the COVID-19 outbreak.  He also has a respiratory infection.  He has not traveled recently.  No fever or chills or myalgias.  But he does have a cough that is slightly productive.    Review of Systems see above-no suicidal ideations     Objective:   Physical Exam Vitals signs reviewed.  Constitutional:      Appearance: He is not ill-appearing or diaphoretic.  HENT:     Right Ear: Tympanic membrane normal.     Left Ear: Tympanic membrane normal.     Nose: Nose normal.     Mouth/Throat:     Mouth: Mucous membranes are moist.     Pharynx: Oropharynx is clear.  Neck:     Musculoskeletal: Neck supple.  Pulmonary:     Effort: Pulmonary effort is normal. No respiratory distress.     Breath sounds: Normal breath sounds. No wheezing or rales.  Lymphadenopathy:     Cervical: No cervical adenopathy.  Skin:    General: Skin is warm and dry.  Psychiatric:        Behavior: Behavior normal.        Thought Content: Thought content normal.        Judgment: Judgment normal.     Comments: Affect is a bit flat           Assessment & Plan:  Anxiety and depression  Acute lower respiratory infection-treat with Levaquin 500 mg daily for 7 days.  Plan: Continue Zoloft and Wellbutrin at current dosages.  Can increase Zoloft to 150 mg if necessary but that is the limit uncomfortable with.  He may need to see psychiatrist for med consult if not improving.  Encouraged counseling.  Situational  stress  Plan: Continue Zoloft 100 mg daily for now.  Can increase to 150 mg if he so desires.  Continue Wellbutrin at 300 mg daily.  Respiratory infection Levaquin 500 mg daily for 7 days.

## 2018-12-17 NOTE — Patient Instructions (Addendum)
Continue current medications Wellbutrin and Zoloft as previously prescribed.  May increase Zoloft up to 150 mg if necessary.  Consider counseling.  Follow-up in 4 months.  Upper respiratory infection have prescribed

## 2018-12-28 ENCOUNTER — Telehealth: Payer: Self-pay | Admitting: Internal Medicine

## 2018-12-28 MED ORDER — LEVOFLOXACIN 500 MG PO TABS: 500.0000 mg | ORAL_TABLET | Freq: Every day | ORAL | 0 refills | Status: DC

## 2018-12-28 NOTE — Telephone Encounter (Signed)
RX was sent in, pt was notified.

## 2018-12-28 NOTE — Telephone Encounter (Signed)
Refill Levaquin for 7 days 500 mg daily and let him know.

## 2018-12-28 NOTE — Telephone Encounter (Signed)
Patrick Sanchez [phone]  [facility]  [name] called to say that he is still sick, runny nose,scratcy throat, fluid in ear occasional fever 99 degrees.

## 2019-01-01 DIAGNOSIS — J22 Unspecified acute lower respiratory infection: Secondary | ICD-10-CM

## 2019-01-08 ENCOUNTER — Encounter: Payer: Self-pay | Admitting: Internal Medicine

## 2019-01-08 ENCOUNTER — Ambulatory Visit (INDEPENDENT_AMBULATORY_CARE_PROVIDER_SITE_OTHER): Payer: BLUE CROSS/BLUE SHIELD | Admitting: Internal Medicine

## 2019-01-08 ENCOUNTER — Other Ambulatory Visit: Payer: Self-pay

## 2019-01-08 ENCOUNTER — Telehealth: Payer: Self-pay | Admitting: Internal Medicine

## 2019-01-08 DIAGNOSIS — H6692 Otitis media, unspecified, left ear: Secondary | ICD-10-CM | POA: Diagnosis not present

## 2019-01-08 DIAGNOSIS — J069 Acute upper respiratory infection, unspecified: Secondary | ICD-10-CM | POA: Diagnosis not present

## 2019-01-08 MED ORDER — PREDNISONE 10 MG PO TABS: ORAL_TABLET | ORAL | 0 refills | Status: DC

## 2019-01-08 MED ORDER — DOXYCYCLINE HYCLATE 100 MG PO TABS: 100.0000 mg | ORAL_TABLET | Freq: Two times a day (BID) | ORAL | 0 refills | Status: DC

## 2019-01-08 NOTE — Progress Notes (Signed)
   Subjective:    Patient ID: Patrick Sanchez, male    DOB: 07-09-1961, 58 y.o.   MRN: [id number]  HPI 58 year old Male with onset today of runny nose left ear pain and congestion after going to the grocery store yesterday.  No documented fever but he has no thermometer.  He called March 23 saying he was still ill with runny nose scratchy throat and low-grade fever of 99 degrees and we called in Levaquin for him for an additional 7 days after having given him a 7-day course of Levaquin on office visit on March 12.  He has been going out to walk the dog.  May have some allergy symptoms.  He is not sure.    Review of Systems see above wife  maybe a fever about a week ago but it was not documented.  2 daughters living in [place] but no contact with them.     Objective:   Physical Exam He does not appear to be in acute distress or tachypnea.  Able to give a clear concise history.  Respiratory rate appears to be normal.       Assessment & Plan:  Acute left otitis media by history  Acute URI  Plan: I feel like he has a another respiratory infection that is needed versus allergic rhinitis.  I am going to give him a short course of prednisone 10 mg going from 40 mg to 0 mg over 5 days.  He is to stay inside.  Doxycycline 100 mg twice daily for 10 days.  Rest and drink plenty of fluids and call us with progress report tomorrow.

## 2019-01-08 NOTE — Telephone Encounter (Signed)
Patrick Sanchez [phone]  Bill called to say that last night he started having a runny nose, this morning he is having trouble hearing, scratchy throat, tired. He did state that he had gotten better from the 2 rounds of medication early this year.

## 2019-01-08 NOTE — Telephone Encounter (Signed)
Set up Web ex visit

## 2019-01-08 NOTE — Telephone Encounter (Signed)
Scheduled webex

## 2019-01-08 NOTE — Patient Instructions (Signed)
Doxycycline 100 mg twice daily for 10 days.  Prednisone 10 mg going from 40 mg to 0 mg over 5 days.  Stay at home.  Call us with progress report next week.

## 2019-01-26 ENCOUNTER — Encounter: Payer: Self-pay | Admitting: Internal Medicine

## 2019-01-26 ENCOUNTER — Telehealth: Payer: Self-pay | Admitting: Internal Medicine

## 2019-01-26 ENCOUNTER — Ambulatory Visit (INDEPENDENT_AMBULATORY_CARE_PROVIDER_SITE_OTHER): Payer: BLUE CROSS/BLUE SHIELD | Admitting: Internal Medicine

## 2019-01-26 DIAGNOSIS — F329 Major depressive disorder, single episode, unspecified: Secondary | ICD-10-CM

## 2019-01-26 DIAGNOSIS — J301 Allergic rhinitis due to pollen: Secondary | ICD-10-CM | POA: Diagnosis not present

## 2019-01-26 DIAGNOSIS — F32A Depression, unspecified: Secondary | ICD-10-CM

## 2019-01-26 DIAGNOSIS — F419 Anxiety disorder, unspecified: Secondary | ICD-10-CM | POA: Diagnosis not present

## 2019-01-26 DIAGNOSIS — M109 Gout, unspecified: Secondary | ICD-10-CM | POA: Diagnosis not present

## 2019-01-26 MED ORDER — ALLOPURINOL 300 MG PO TABS: ORAL_TABLET | ORAL | 11 refills | Status: DC

## 2019-01-26 MED ORDER — METHYLPREDNISOLONE 4 MG PO TABS: ORAL_TABLET | ORAL | 0 refills | Status: DC

## 2019-01-26 NOTE — Patient Instructions (Addendum)
Take Medrol and tapering course as directed 6-6-5-5-4-4-3-3-2-2-1-1 for gout and allergic rhinitis symptoms.  Restart allopurinol 300 mg daily.  Continue to antidepressant therapy.

## 2019-01-26 NOTE — Telephone Encounter (Signed)
Scheduled virtual Visit °

## 2019-01-26 NOTE — Telephone Encounter (Signed)
Patrick Sanchez [phone]  [name] called to say over the weekend his big toe started hurting, with very large blister and a purple mark around it. He also said that he is not getting better, he still continues to have running nose, pressure in his ears, scratchy throat, body aches and pains, low grade fever typically in the evenings. The symptoms seem to come and go. He would like to be tested for COVID-19

## 2019-01-26 NOTE — Progress Notes (Signed)
   Subjective:    Patient ID: Patrick Sanchez, male    DOB: Jun 09, 1961, 58 y.o.   MRN: [id number]  HPI 58 year old Male who had onset of blister right great toe and medial aspect MTP joint on Saturday, April 18.  It has been very painful.  He has no known injury.  He says there is no pus just a clear blister present.  He has a prior history of gout.  He had run out of allopurinol,and subsequently he quit taking it.  He continues to describe a low-grade fever but on further questioning it is less than 100 degrees.  He continues to complain of nasal congestion and postnasal drip.  He does take dog for a walk.  His wife is concerned that he may have COVID-19.  He wants to be tested.  Explained to him that there is a shortage of test media and he really does not have criteria for a fever.  He only has a cough.  He does not have discolored nasal drainage.  Patient was identified by 2 identifiers as Patrick Sanchez, a longstanding patient in this practice.  He agreed to virtual visit via interactive audio and video communications which were achieved without difficulty.  He has a history of depression and says that Zoloft is working nicely for him.  He has 2 daughters living in [place] but says they are doing fine.    Review of Systems see above-still has some mild cough.  Has felt achy at times and feels like he has had some chills.  Symptoms were particularly bad on Friday and Saturday.     Objective:   Physical Exam  Spent 25 minutes speaking with him via virtual visit regarding 2 problems persistent cough and right great toe lesion.  There is a bullous lesion medial aspect right great toe MTP joint which appears to be approximately 1.2 cm in length and about 6 mm in diameter just looking at it on camera.  There is some surrounding erythema.  He does not sound particularly nasally congested.      Assessment & Plan:  Explained  to him that we do not have any test media for COVID-19  at this office at the present time.  I think he is low risk for COVID-19 and the symptoms are explainable by other medical entities such as allergic rhinitis and acute gout.  There is no travel history.  Plan: He will take Medrol 4 mg tablets in a tapering course going from 24 mg to 0 mg-12-day course in other words 6-6 -5- 5-4-4-3-3 -2-2 1-1 taper.  He understands and agrees to this.  He will restart allopurinol 300 mg daily and continue that indefinitely.  Continue with current 2 drug regimen antidepressant therapy for anxiety and depression.  Medrol will treat both allergic rhinitis symptoms including cough and gout as well.

## 2019-01-26 NOTE — Telephone Encounter (Signed)
We do not have any test kits. Does he have gout?

## 2019-01-27 ENCOUNTER — Other Ambulatory Visit: Payer: Self-pay | Admitting: Internal Medicine

## 2019-03-14 ENCOUNTER — Other Ambulatory Visit: Payer: Self-pay | Admitting: Internal Medicine

## 2019-05-06 ENCOUNTER — Telehealth: Payer: Self-pay | Admitting: Internal Medicine

## 2019-05-06 NOTE — Telephone Encounter (Signed)
Patrick Sanchez [phone]  [name] called to say that his left shoulder has frozen up like his right shoulder done a few years ago. He was wandering if there was a medication you could call in for him, without seeing him.

## 2019-05-06 NOTE — Telephone Encounter (Signed)
[employer], he verbalized understanding.

## 2019-05-06 NOTE — Telephone Encounter (Signed)
After speaking with Dr Renold Genta she recommended Patrick Sanchez calling and getting an appointment with the Ortho doctor he had seen in the past.   I called Patrick Sanchez to let him know what Dr Renold Genta said and he said he was going to do the exercises that he done before and hope that will help, he really did not want to call ortho, he stated it did not help before. I reminded him he seen Dr Marchia Bond at [facility], and told him to follow up with them if he didn't get better in a few days.

## 2019-05-06 NOTE — Telephone Encounter (Signed)
He can try Aleve or Advil, heat or ice.

## 2019-06-03 ENCOUNTER — Telehealth: Payer: Self-pay | Admitting: Internal Medicine

## 2019-06-03 NOTE — Telephone Encounter (Signed)
[name] called back to see if there was somewhere to go and get COVID test done so he could get results back before his daughter comes in from [place] tomorrow.

## 2019-06-03 NOTE — Telephone Encounter (Signed)
Due to Covid restrictions can only have Doxy visit. This will have to be tomorrow.

## 2019-06-03 NOTE — Telephone Encounter (Signed)
Patrick Sanchez [phone]  [name] called to say he has ear infection, sore throat, fatigue and diarrhea (severe at times), this has been going on for the last 3 weeks, he feels like it began when they got kiddy pool, maybe got some kind of bacteria from the water. Would like to be seen.

## 2019-06-03 NOTE — Telephone Encounter (Signed)
Scheduled appointment

## 2019-06-04 ENCOUNTER — Encounter: Payer: Self-pay | Admitting: Internal Medicine

## 2019-06-04 ENCOUNTER — Ambulatory Visit (INDEPENDENT_AMBULATORY_CARE_PROVIDER_SITE_OTHER): Payer: BC Managed Care – PPO | Admitting: Internal Medicine

## 2019-06-04 ENCOUNTER — Other Ambulatory Visit: Payer: Self-pay

## 2019-06-04 DIAGNOSIS — B354 Tinea corporis: Secondary | ICD-10-CM

## 2019-06-04 DIAGNOSIS — R197 Diarrhea, unspecified: Secondary | ICD-10-CM

## 2019-06-04 DIAGNOSIS — R21 Rash and other nonspecific skin eruption: Secondary | ICD-10-CM | POA: Diagnosis not present

## 2019-06-04 DIAGNOSIS — H6691 Otitis media, unspecified, right ear: Secondary | ICD-10-CM | POA: Diagnosis not present

## 2019-06-04 MED ORDER — HYDROCODONE-ACETAMINOPHEN 10-325 MG PO TABS: 1.0000 | ORAL_TABLET | Freq: Three times a day (TID) | ORAL | 0 refills | Status: AC | PRN

## 2019-06-04 MED ORDER — CIPROFLOXACIN HCL 500 MG PO TABS: 500.0000 mg | ORAL_TABLET | Freq: Two times a day (BID) | ORAL | 0 refills | Status: DC

## 2019-06-04 MED ORDER — KETOCONAZOLE 2 % EX CREA: 1.0000 "application " | TOPICAL_CREAM | Freq: Every day | CUTANEOUS | 0 refills | Status: DC

## 2019-06-04 NOTE — Patient Instructions (Signed)
Stool studies pending. Cipro 500 mg bid x 10 days. Norco 10/325  One po q 8 hours prn pain. Clear liquids and advance diet slowly. Nizoral to foot for presumed fungal infection daily x 3 weeks.

## 2019-06-04 NOTE — Progress Notes (Signed)
   Subjective:    Patient ID: Patrick Sanchez, male    DOB: [birth year], 58 y.o.   MRN: [id number]  HPI 58 year old Male seen by interactive audio and video telecommunications today due to the Coronavirus pandemic.  He is agreeable to visit in this format today.  He is identified using 2 identifiers as Patrick Sanchez, a longstanding patient in this practice.  Patient says around August 10 he and his wife purchased and set up a portable swimming pool in their yard.  He did not add chlorine to the water and left it uncovered.  He and his wife spent a couple of days enjoying the pool.  11 May 2013 he was complaining of right ear pain head congestion and right sided sore throat.  He also developed diarrhea which has persisted.  Describes stool is yellow mucus.  Does not recall eating anything that would have started the diarrhea.  Also he has noticed a lesion on his left foot that he thinks is a ringworm.  He also thinks this could have come from the swimming pool.  Has not been in the pool recently.  It developed a hole in it and he was able to get a refund in order another pool but has not set it up.  Complaining mainly of right ear pain and diarrhea.  No travel history to explain the diarrhea.  Does not think he has COVID-19.  Only goes to pharmacy and grocery store.  Wife has been staying at home.  She does not have diarrhea nor ear or respiratory infection symptoms.  Their daughters are coming to visit from [place] this weekend but will be socially distancing.  He agrees to wear a mask and keep distance from them.    Review of Systems denies fever chills nausea or vomiting. No headache or loss of taste or smell    Objective:   Physical Exam  Seen partially and looks fatigued.  There is a nickel sized lesion on his left foot with a raised border that looks to be consistent with Tinea corporis.  He appears to be in no acute distress.  Reports being afebrile.      Assessment & Plan:   Diarrhea-etiology unclear-need to rule out infectious diarrhea and he will pick up containers to collect stool studies.  Right ear pain-cannot examine today virtually but suspect he has right otitis media and may be right otitis externa from swimming.  We are going to call in Cipro 500 mg twice daily for 10 days but he needs to submit stool studies first.  For ear pain, he is requesting pain medication.  Have prescribed Norco 10/325 #15 1 p.o. every 8 hours as needed for pain.  Needs to go on clear liquids and advance diet slowly over several days.  For rash on foot likely Tinea corporis, Nizoral cream daily x 3 weeks

## 2019-06-05 ENCOUNTER — Encounter: Payer: Self-pay | Admitting: Internal Medicine

## 2019-06-05 ENCOUNTER — Telehealth (INDEPENDENT_AMBULATORY_CARE_PROVIDER_SITE_OTHER): Payer: Self-pay | Admitting: Internal Medicine

## 2019-06-05 DIAGNOSIS — R197 Diarrhea, unspecified: Secondary | ICD-10-CM

## 2019-06-05 NOTE — Telephone Encounter (Signed)
Pt has C. Difficile toxin on stool study. Did take some of wife's left over antibiotics for ear infection recently. Diarrhea started well before that. Spoke with him by phone today with results and treatment discussed. He must stop current antibiotic prescribed for otalgia yesterday. Have Called in Vancomycin 2.5 mg 4 times daily for 10 days. Pharmacy currently closed.

## 2019-06-08 ENCOUNTER — Other Ambulatory Visit: Payer: Self-pay

## 2019-06-08 MED FILL — FIRVANQ 50 MG/ML SOLN: 50 | 10 days supply | Qty: 300 | Fill #0

## 2019-06-09 LAB — STOOL CULTURE
MICRO NUMBER:: 823620
MICRO NUMBER:: 823621
MICRO NUMBER:: 823624
SHIGA RESULT:: NOT DETECTED
SPECIMEN QUALITY:: ADEQUATE
SPECIMEN QUALITY:: ADEQUATE
SPECIMEN QUALITY:: ADEQUATE

## 2019-06-09 LAB — FECAL LACTOFERRIN, QUANT
Fecal Lactoferrin: NEGATIVE
MICRO NUMBER:: 823622
SPECIMEN QUALITY:: ADEQUATE

## 2019-06-09 LAB — OVA AND PARASITE EXAMINATION
CONCENTRATE RESULT:: NONE SEEN
MICRO NUMBER:: 823623
SPECIMEN QUALITY:: ADEQUATE
TRICHROME RESULT:: NONE SEEN

## 2019-06-09 LAB — CLOSTRIDIUM DIFFICILE TOXIN B, QUALITATIVE, REAL-TIME PCR: Toxigenic C. Difficile by PCR: DETECTED — AB

## 2019-06-15 ENCOUNTER — Telehealth: Payer: Self-pay | Admitting: Internal Medicine

## 2019-06-15 NOTE — Telephone Encounter (Signed)
Patrick Sanchez [phone]  Bill called to say he is not improving, had a short period on Sunday, however yesterday back to almost like he was in the begging. He also stated that at times his ear is pounding and he is also still having ear, nose and throat issues. He only has 1 hydrocodone left, so does he need refill or should e start trying to take something over the counter.

## 2019-06-15 NOTE — Telephone Encounter (Signed)
I don't think he has completed 10 days of medication yet. If diarrhea is the problem, he needs to stay with clear liquids for several days. I had hoped the ear would respond to the med he is on. This diarrhea can persist for about 2 weeks. If I change to Flagyl, he cannot drink alcohol at all because it will make him nauseated.

## 2019-06-15 NOTE — Telephone Encounter (Signed)
Called Bill back and he verbalized understanding and will try clear liquids and see how the next few days go.

## 2019-06-16 ENCOUNTER — Encounter: Payer: Self-pay | Admitting: Internal Medicine

## 2019-06-16 ENCOUNTER — Telehealth: Payer: Self-pay | Admitting: Internal Medicine

## 2019-06-16 NOTE — Telephone Encounter (Signed)
Patient called after office hours yesterday to say he had gotten a bill from [facility] for $1200 for oral antibiotic. I have a copy of the prescription sent to [place] for Patrick Sanchez which is what the Pharmacy supervisor at [facility] told me would be covered. They would not cover another oral vancomycin preparation at a different dose but would cover this and [facility] said they had this in stock. Otherwise it would have had to be ordered at [facility] and would have delayed treatment at least 2 days.  We have called patient to pick up this copy of the prescription sent and letter saying Patrick Sanchez is their preferred med. The letter has their corporate pharmacy number and patient needs to contact them.

## 2019-06-17 ENCOUNTER — Ambulatory Visit (INDEPENDENT_AMBULATORY_CARE_PROVIDER_SITE_OTHER): Payer: BC Managed Care – PPO | Admitting: Internal Medicine

## 2019-06-17 ENCOUNTER — Other Ambulatory Visit: Payer: Self-pay

## 2019-06-17 VITALS — Temp 98.6°F | Ht 67.0 in | Wt 160.0 lb

## 2019-06-17 DIAGNOSIS — R197 Diarrhea, unspecified: Secondary | ICD-10-CM

## 2019-06-17 DIAGNOSIS — H6123 Impacted cerumen, bilateral: Secondary | ICD-10-CM

## 2019-06-17 DIAGNOSIS — A0472 Enterocolitis due to Clostridium difficile, not specified as recurrent: Secondary | ICD-10-CM

## 2019-06-17 NOTE — Progress Notes (Addendum)
   Subjective:    Patient ID: Patrick Sanchez, male    DOB: 1961-01-06, 58 y.o.   MRN: [id number]  HPI He has been taking some Norco for ear pain. Had a  semi formed stool today. Otherwise is still having black liquid stools. Does drink alcohol in the evenigs. Has been advised to stay with liquids and advance slowly to soft diet. Try crackers. Complaining of ear pain bilaterally.  Was found to have C.diff diarrhea late August. At that time was also given Rx for Cipro for presumed enteric infection which he was told not to take. However did take some of his wife's antibiotics for ear pain prior to being diagnosed with C. Difficile. Has been treated with liquid oral Vancomycin. Slow to improve.       Review of Systems see above     Objective:   Physical Exam  Ear wax removed with currette from each ear. More cerumen in left than right ear. Left TM is dull but not red or full. Feels better since cerumen removed. Right TM slightly dull but not red or full. Labs drawn and pending.      Assessment & Plan:  Impacted cerumen both ears  Plan: Finish course of Vancomycin. See if ear pain improves with wax removal.  CBC and C-met drawn for complaint of persistent diarrhea. WBC normal. BUN 1.08. Mild elevation of SGOT and SGPT.

## 2019-06-18 LAB — CBC WITH DIFFERENTIAL/PLATELET
Absolute Monocytes: 698 cells/uL (ref 200–950)
Basophils Absolute: 120 cells/uL (ref 0–200)
Basophils Relative: 1.6 %
Eosinophils Absolute: 270 cells/uL (ref 15–500)
Eosinophils Relative: 3.6 %
HCT: 45.9 % (ref 38.5–50.0)
Hemoglobin: 15.6 g/dL (ref 13.2–17.1)
Lymphs Abs: 1913 cells/uL (ref 850–3900)
MCH: 32.8 pg (ref 27.0–33.0)
MCHC: 34 g/dL (ref 32.0–36.0)
MCV: 96.4 fL (ref 80.0–100.0)
MPV: 13.2 fL — ABNORMAL HIGH (ref 7.5–12.5)
Monocytes Relative: 9.3 %
Neutro Abs: 4500 cells/uL (ref 1500–7800)
Neutrophils Relative %: 60 %
Platelets: 227 10*3/uL (ref 140–400)
RBC: 4.76 10*6/uL (ref 4.20–5.80)
RDW: 12.7 % (ref 11.0–15.0)
Total Lymphocyte: 25.5 %
WBC: 7.5 10*3/uL (ref 3.8–10.8)

## 2019-06-18 LAB — COMPLETE METABOLIC PANEL WITH GFR
AG Ratio: 1.6 (calc) (ref 1.0–2.5)
ALT: 51 U/L — ABNORMAL HIGH (ref 9–46)
AST: 39 U/L — ABNORMAL HIGH (ref 10–35)
Albumin: 4.2 g/dL (ref 3.6–5.1)
Alkaline phosphatase (APISO): 76 U/L (ref 35–144)
BUN: 13 mg/dL (ref 7–25)
CO2: 27 mmol/L (ref 20–32)
Calcium: 10 mg/dL (ref 8.6–10.3)
Chloride: 104 mmol/L (ref 98–110)
Creat: 1.08 mg/dL (ref 0.70–1.33)
GFR, Est African American: 87 mL/min/{1.73_m2} (ref >=60)
GFR, Est Non African American: 75 mL/min/{1.73_m2} (ref >=60)
Globulin: 2.7 g/dL (calc) (ref 1.9–3.7)
Glucose, Bld: 100 mg/dL — ABNORMAL HIGH (ref 65–99)
Potassium: 4.2 mmol/L (ref 3.5–5.3)
Sodium: 143 mmol/L (ref 135–146)
Total Bilirubin: 0.4 mg/dL (ref 0.2–1.2)
Total Protein: 6.9 g/dL (ref 6.1–8.1)

## 2019-06-21 NOTE — Telephone Encounter (Signed)
If his ear pain does not improve, we will send him to ENT. I am reluctant to prescribe antibiotics for ear if he is getting better from C. Difficile.

## 2019-06-21 NOTE — Telephone Encounter (Signed)
Patrick Sanchez called back this morning to say he had a remarkable change over the weekend and is feeling much better, he was able to start exercising and his color is becoming more normal. Ear is still hurting, however he wants to get much better and be sure he is completely over this before treating ear.

## 2019-06-21 NOTE — Telephone Encounter (Signed)
Called to let patient know that if ear does not get better that Dr Renold Genta will refer him to ENT. When I called back he was not feeling as good as he did earlier, still having some bouts of diarrhea. Told him not to get discourage that this does take time, and to hang in there.

## 2019-06-22 ENCOUNTER — Encounter: Payer: Self-pay | Admitting: Internal Medicine

## 2019-06-22 NOTE — Patient Instructions (Addendum)
Ear wax removed from both ears. Finish course of Vancomycin. CBC stable. Has mild elevation of SGOT and SGPT. Watch alcohol consumption.

## 2019-06-22 NOTE — Telephone Encounter (Signed)
Please make referral to Dr. Erik Obey

## 2019-06-22 NOTE — Telephone Encounter (Signed)
Bill called to say that an ENT would be good, especially if there is concern about permanent damage to ear. He would like to be referred to Dr Erik Obey. He definitely understands about not wanting to take any more antibiotics at this time.

## 2019-06-22 NOTE — Telephone Encounter (Signed)
Faxed referral to Dr [facility] office along with demographics and office notes. Called Bill to let him know referral was placed and he could call and schedule appointment.  [facility] ENT Office Number [phone] Fax number [phone]

## 2019-06-25 ENCOUNTER — Telehealth: Payer: Self-pay

## 2019-06-25 DIAGNOSIS — R197 Diarrhea, unspecified: Secondary | ICD-10-CM

## 2019-06-25 NOTE — Telephone Encounter (Signed)
Patient called states he is feeling wonderful as of yesterday he has not had any diarrhea and he is feeling great.

## 2019-06-25 NOTE — Telephone Encounter (Signed)
That's good news!

## 2019-06-29 DIAGNOSIS — J310 Chronic rhinitis: Secondary | ICD-10-CM | POA: Diagnosis not present

## 2019-06-29 DIAGNOSIS — H9209 Otalgia, unspecified ear: Secondary | ICD-10-CM | POA: Diagnosis not present

## 2019-06-29 DIAGNOSIS — J039 Acute tonsillitis, unspecified: Secondary | ICD-10-CM | POA: Diagnosis not present

## 2019-06-29 DIAGNOSIS — H93293 Other abnormal auditory perceptions, bilateral: Secondary | ICD-10-CM | POA: Diagnosis not present

## 2019-06-29 DIAGNOSIS — H9203 Otalgia, bilateral: Secondary | ICD-10-CM | POA: Diagnosis not present

## 2019-07-01 NOTE — Addendum Note (Signed)
Addended by: Mady Haagensen on: 07/01/2019 11:50 AM   Modules accepted: Orders

## 2019-07-01 NOTE — Telephone Encounter (Signed)
Please make referral to GI

## 2019-07-01 NOTE — Telephone Encounter (Signed)
Referral was put in, pt aware.

## 2019-07-01 NOTE — Telephone Encounter (Signed)
Pt called and said that he started to feel bad again starting Monday 06/28/19, He said he is back to having diarrhea 4 times a day and he wants to know what Dr Renold Genta recommends.

## 2019-07-05 DIAGNOSIS — K529 Noninfective gastroenteritis and colitis, unspecified: Secondary | ICD-10-CM | POA: Diagnosis not present

## 2019-07-07 DIAGNOSIS — K529 Noninfective gastroenteritis and colitis, unspecified: Secondary | ICD-10-CM | POA: Diagnosis not present

## 2019-07-16 ENCOUNTER — Telehealth: Payer: Self-pay | Admitting: Internal Medicine

## 2019-07-16 NOTE — Telephone Encounter (Signed)
Called Patrick Sanchez to let him know what Dr Renold Genta had said, he verbalized understanding and stated that he had picked up some Tussin and he was going to take that.

## 2019-07-16 NOTE — Telephone Encounter (Signed)
Due to his recent bout of diarrhea, I will not be prescribing any antibiotics. He needs to ride this out and see if he gets better.

## 2019-07-16 NOTE — Telephone Encounter (Signed)
Patrick Sanchez [phone]  Bill called to say that he has a lot of congestion, some nasal and in chest, coughing up phlegm, sometimes so much he feels like he is going to threw up. No fever. He is scheduled to have COVID test on Monday because he is having colon procedure with Dr Watt Climes on Thursday.

## 2019-07-19 DIAGNOSIS — Z1159 Encounter for screening for other viral diseases: Secondary | ICD-10-CM | POA: Diagnosis not present

## 2019-07-22 DIAGNOSIS — K5730 Diverticulosis of large intestine without perforation or abscess without bleeding: Secondary | ICD-10-CM | POA: Diagnosis not present

## 2019-07-22 DIAGNOSIS — R197 Diarrhea, unspecified: Secondary | ICD-10-CM | POA: Diagnosis not present

## 2019-07-23 ENCOUNTER — Other Ambulatory Visit: Payer: Self-pay | Admitting: Gastroenterology

## 2019-07-23 ENCOUNTER — Ambulatory Visit
Admission: RE | Admit: 2019-07-23 | Discharge: 2019-07-23 | Disposition: A | Discharge status: Home / Self Care | Payer: BC Managed Care – PPO | Source: Ambulatory Visit | Attending: Gastroenterology | Admitting: Gastroenterology

## 2019-07-23 DIAGNOSIS — R1084 Generalized abdominal pain: Secondary | ICD-10-CM

## 2019-07-26 ENCOUNTER — Telehealth: Payer: Self-pay | Admitting: Internal Medicine

## 2019-07-26 ENCOUNTER — Other Ambulatory Visit: Payer: Self-pay

## 2019-07-26 MED ORDER — ALLOPURINOL 300 MG PO TABS: ORAL_TABLET | ORAL | 11 refills | Status: DC

## 2019-07-26 NOTE — Telephone Encounter (Signed)
RX already sent to pharmacy

## 2019-07-29 ENCOUNTER — Telehealth: Payer: Self-pay | Admitting: Internal Medicine

## 2019-07-29 MED ORDER — BUPROPION HCL ER (XL) 300 MG PO TB24: 300.0000 mg | ORAL_TABLET | Freq: Every day | ORAL | 0 refills | Status: DC

## 2019-07-29 MED ORDER — ATORVASTATIN CALCIUM 10 MG PO TABS: 10.0000 mg | ORAL_TABLET | Freq: Every day | ORAL | 0 refills | Status: DC

## 2019-07-29 NOTE — Telephone Encounter (Signed)
Received Fax RX request from  [place]  Medication - buPROPion (WELLBUTRIN XL) 300 MG 24 hr tablet   Last Refill - 05/03/19  Last OV - 06/17/19  Last CPE - 06/25/18  Next CPE scheduled 08/20/19

## 2019-07-29 NOTE — Telephone Encounter (Addendum)
Aleph Sparhawk [id number]  [place]   atorvastatin (LIPITOR) 10 MG tablet    Rush Landmark called to say that he had changed pharmacies and needs a prescription sent in, he is completely out.

## 2019-08-05 ENCOUNTER — Ambulatory Visit: Payer: Self-pay | Admitting: Gastroenterology

## 2019-08-17 ENCOUNTER — Other Ambulatory Visit: Payer: Self-pay

## 2019-08-17 ENCOUNTER — Other Ambulatory Visit: Payer: BC Managed Care – PPO | Admitting: Internal Medicine

## 2019-08-17 DIAGNOSIS — F419 Anxiety disorder, unspecified: Secondary | ICD-10-CM

## 2019-08-17 DIAGNOSIS — N529 Male erectile dysfunction, unspecified: Secondary | ICD-10-CM | POA: Diagnosis not present

## 2019-08-17 DIAGNOSIS — G250 Essential tremor: Secondary | ICD-10-CM

## 2019-08-17 DIAGNOSIS — E782 Mixed hyperlipidemia: Secondary | ICD-10-CM | POA: Diagnosis not present

## 2019-08-17 DIAGNOSIS — F439 Reaction to severe stress, unspecified: Secondary | ICD-10-CM | POA: Diagnosis not present

## 2019-08-17 DIAGNOSIS — Z8546 Personal history of malignant neoplasm of prostate: Secondary | ICD-10-CM

## 2019-08-17 DIAGNOSIS — D369 Benign neoplasm, unspecified site: Secondary | ICD-10-CM

## 2019-08-17 DIAGNOSIS — F329 Major depressive disorder, single episode, unspecified: Secondary | ICD-10-CM

## 2019-08-17 DIAGNOSIS — Z0000 Encounter for general adult medical examination without abnormal findings: Secondary | ICD-10-CM | POA: Diagnosis not present

## 2019-08-17 DIAGNOSIS — F32A Depression, unspecified: Secondary | ICD-10-CM

## 2019-08-17 DIAGNOSIS — Z125 Encounter for screening for malignant neoplasm of prostate: Secondary | ICD-10-CM

## 2019-08-18 LAB — CBC WITH DIFFERENTIAL/PLATELET
Absolute Monocytes: 992 cells/uL — ABNORMAL HIGH (ref 200–950)
Basophils Absolute: 100 cells/uL (ref 0–200)
Basophils Relative: 1.1 %
Eosinophils Absolute: 364 cells/uL (ref 15–500)
Eosinophils Relative: 4 %
HCT: 46.9 % (ref 38.5–50.0)
Hemoglobin: 15.6 g/dL (ref 13.2–17.1)
Lymphs Abs: 1511 cells/uL (ref 850–3900)
MCH: 32.2 pg (ref 27.0–33.0)
MCHC: 33.3 g/dL (ref 32.0–36.0)
MCV: 96.7 fL (ref 80.0–100.0)
MPV: 12.9 fL — ABNORMAL HIGH (ref 7.5–12.5)
Monocytes Relative: 10.9 %
Neutro Abs: 6133 cells/uL (ref 1500–7800)
Neutrophils Relative %: 67.4 %
Platelets: 192 10*3/uL (ref 140–400)
RBC: 4.85 10*6/uL (ref 4.20–5.80)
RDW: 12.7 % (ref 11.0–15.0)
Total Lymphocyte: 16.6 %
WBC: 9.1 10*3/uL (ref 3.8–10.8)

## 2019-08-18 LAB — COMPLETE METABOLIC PANEL WITH GFR
AG Ratio: 1.8 (calc) (ref 1.0–2.5)
ALT: 33 U/L (ref 9–46)
AST: 26 U/L (ref 10–35)
Albumin: 4.2 g/dL (ref 3.6–5.1)
Alkaline phosphatase (APISO): 74 U/L (ref 35–144)
BUN: 19 mg/dL (ref 7–25)
CO2: 25 mmol/L (ref 20–32)
Calcium: 9.1 mg/dL (ref 8.6–10.3)
Chloride: 102 mmol/L (ref 98–110)
Creat: 0.91 mg/dL (ref 0.70–1.33)
GFR, Est African American: 107 mL/min/{1.73_m2} (ref >=60)
GFR, Est Non African American: 93 mL/min/{1.73_m2} (ref >=60)
Globulin: 2.3 g/dL (calc) (ref 1.9–3.7)
Glucose, Bld: 81 mg/dL (ref 65–99)
Potassium: 4.2 mmol/L (ref 3.5–5.3)
Sodium: 138 mmol/L (ref 135–146)
Total Bilirubin: 1 mg/dL (ref 0.2–1.2)
Total Protein: 6.5 g/dL (ref 6.1–8.1)

## 2019-08-18 LAB — PSA: PSA: <0.1 ng/mL (ref <=4.0)

## 2019-08-18 LAB — LIPID PANEL
Cholesterol: 239 mg/dL — ABNORMAL HIGH (ref <200)
HDL: 56 mg/dL (ref >=40)
LDL Cholesterol (Calc): 139 mg/dL (calc) — ABNORMAL HIGH
Non-HDL Cholesterol (Calc): 183 mg/dL (calc) — ABNORMAL HIGH (ref <130)
Total CHOL/HDL Ratio: 4.3 (calc) (ref <5.0)
Triglycerides: 306 mg/dL — ABNORMAL HIGH (ref <150)

## 2019-08-20 ENCOUNTER — Encounter: Payer: Self-pay | Admitting: Internal Medicine

## 2019-08-20 ENCOUNTER — Other Ambulatory Visit: Payer: Self-pay

## 2019-08-20 ENCOUNTER — Ambulatory Visit (INDEPENDENT_AMBULATORY_CARE_PROVIDER_SITE_OTHER): Payer: BC Managed Care – PPO | Admitting: Internal Medicine

## 2019-08-20 VITALS — BP 140/90 | HR 88 | Temp 98.0°F | Ht 67.0 in | Wt 162.0 lb

## 2019-08-20 DIAGNOSIS — A0472 Enterocolitis due to Clostridium difficile, not specified as recurrent: Secondary | ICD-10-CM | POA: Diagnosis not present

## 2019-08-20 DIAGNOSIS — M25512 Pain in left shoulder: Secondary | ICD-10-CM

## 2019-08-20 DIAGNOSIS — F32A Depression, unspecified: Secondary | ICD-10-CM

## 2019-08-20 DIAGNOSIS — G250 Essential tremor: Secondary | ICD-10-CM

## 2019-08-20 DIAGNOSIS — F439 Reaction to severe stress, unspecified: Secondary | ICD-10-CM

## 2019-08-20 DIAGNOSIS — F419 Anxiety disorder, unspecified: Secondary | ICD-10-CM

## 2019-08-20 DIAGNOSIS — Z8546 Personal history of malignant neoplasm of prostate: Secondary | ICD-10-CM

## 2019-08-20 DIAGNOSIS — Z0000 Encounter for general adult medical examination without abnormal findings: Secondary | ICD-10-CM | POA: Diagnosis not present

## 2019-08-20 DIAGNOSIS — M7542 Impingement syndrome of left shoulder: Secondary | ICD-10-CM

## 2019-08-20 DIAGNOSIS — N529 Male erectile dysfunction, unspecified: Secondary | ICD-10-CM

## 2019-08-20 DIAGNOSIS — Z8739 Personal history of other diseases of the musculoskeletal system and connective tissue: Secondary | ICD-10-CM

## 2019-08-20 DIAGNOSIS — E782 Mixed hyperlipidemia: Secondary | ICD-10-CM

## 2019-08-20 DIAGNOSIS — D369 Benign neoplasm, unspecified site: Secondary | ICD-10-CM

## 2019-08-20 DIAGNOSIS — F329 Major depressive disorder, single episode, unspecified: Secondary | ICD-10-CM

## 2019-08-20 LAB — POCT URINALYSIS DIPSTICK
Appearance: NEGATIVE
Bilirubin, UA: NEGATIVE
Blood, UA: NEGATIVE
Glucose, UA: NEGATIVE
Ketones, UA: NEGATIVE
Leukocytes, UA: NEGATIVE
Nitrite, UA: NEGATIVE
Odor: NEGATIVE
Protein, UA: NEGATIVE
Spec Grav, UA: 1.01 (ref 1.010–1.025)
Urobilinogen, UA: 0.2 E.U./dL
pH, UA: 7 (ref 5.0–8.0)

## 2019-08-20 MED ORDER — METHYLPREDNISOLONE ACETATE 80 MG/ML IJ SUSP
80.0000 mg | Freq: Once | INTRAMUSCULAR | Status: AC
  Administered 2019-08-20: 80 mg via INTRAMUSCULAR

## 2019-08-20 NOTE — Progress Notes (Signed)
Subjective:    Patient ID: Patrick Sanchez, male    DOB: [birth year], 58 y.o.   MRN: [id number]  HPI 58 year old Male for health maintenance exam and evaluation of medical issues.  Still having diarrhea 2-3 daily brown water. S/P treatment for C.diff.  in September.  He is taking Immodium.  Drinks 4-5 alcoholic drinks at night.  Needs to check back with gastroenterologist regarding persistent diarrhea.  History of depression- treated with Zoloft.  Feels that Zoloft and Wellbutrin has made a great deal of difference in his affect.  History of erectile dysfunction status post robotic assisted prostatectomy for prostate cancer 2016.  Takes as needed Viagra or Cialis.  History of gout for which he takes allopurinol.  History of hyperlipidemia.  History of large lipoma right upper arm evaluated at [facility] in the past and surgery not recommended.  Take statin medication for hyperlipidemia.  Having some issues with left shoulder pain.  Has been doing yard work and helping neighbors with yard debris  Social history: He is married.  Has 2 adult daughters living in [place].  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  He has worked in [employer] but had some difficulty finding clients.  He is now unemployed.  He spends a lot of time at home.  He and his wife have some financial issues.  Consumes alcohol daily.  He does not smoke.  Family history: Mother died with pancreatic cancer.  Father with history of diabetes MI status post CABG.  Apparently father was bedridden at the end of his life due to heart failure.  Patient is allergic to sulfa.  History of allergic rhinitis.  He was allergy tested by Dr. Donneta Romberg in the past and had reaction to perennial allergens.  Allergy vaccines were recommended but patient never pursued that.  History of psoriasis.  Right inguinal hernia repair 1972.  Ganglion cyst removed from left wrist  1971.  Mastectomy 1999 by Dr. Etta Quill.  Thrombosed hemorrhoid incised and drained 1997.  Right elbow epicondylitis 2003.  Negative Cardiolite study 2003 by Dr. Ron Parker.  Colonoscopy 2017 with 2 tubular adenomas removed.  Exercising and doing yard work.   Review of Systems Has left shoulder pain especially after a fall last Saturday. Fell on right shoulder but now has pain and decreased ROM left shoulder.     Objective:   Physical Exam Blood pressure 140/90 pulse 88 temperature 98 degrees pulse oximetry 99% weight 162 pounds.  BMI 25.47 he has essential tremor.  Decreased range of motion left shoulder.  Skin warm and dry.  Nodes none.  TMs are clear.  Neck is supple.  No carotid bruits.  No thyromegaly.  Chest clear to auscultation.  Cardiac exam regular rate and rhythm normal S1 and S2.  Abdomen soft nondistended without hepatosplenomegaly masses or tenderness.  Prostate exam deferred status post radical prostatectomy.  Extremities without edema.  Affect is normal.  Neurological exam: Essential tremor otherwise noted focal deficits.       Assessment & Plan:  History of C. difficile diarrhea status post treatment with oral vancomycin and has seen gastroenterologist but has persistent diarrhea.  He will recontact gastroenterologist.  Essential tremor treated with propranolol  Left frozen shoulder-this was injected today with Depo-Medrol Marcaine and Xylocaine.  Recommend range of motion exercises.  He has had a right frozen shoulder in the remote past and knows what to do  Anxiety depression  improved when SSRI was added to Wellbutrin.  History of gout treated with allopurinol  History of hyperlipidemia-treated with statin medication  Situational stress with finances and unemployment  History of prostate cancer-PSA is less than 0.1  Plan: He is on low-dose Lipitor.  His triglycerides are markedly elevated at 306 and previously were 197 a year ago.  Total cholesterol was 239 and LDL  cholesterol was 139.  He will watch his diet and get more exercise and follow-up in a couple of months.  Liver functions are normal.

## 2019-08-23 ENCOUNTER — Telehealth: Payer: Self-pay | Admitting: Internal Medicine

## 2019-08-23 NOTE — Telephone Encounter (Signed)
Called and spoke with Rush Landmark and DD about MyChart message, Rush Landmark started having ear nose and throat pain yesterday, where it was an 8 when he is up and a 5 when laying down. He was laying down when I called just not feeling well. No fever. He is also having a lot of shoulder pain and diarrhea, DD feels like maybe the Tylenol and ibuprofen may be keeping his stomach upset and is wandering if there is something else he could take for pain. He has virtual visit with GI on Friday. Does not want to see Dr Erik Obey again because of some untruths in last visit that is attached to [place] visit. He was around some neighbors on Saturday helping cut down a tree that had falling, hoping he was not exposed to [place], I did let him know it would be to soon to have symptoms from that. That they are now saying 5-7 days.

## 2019-08-23 NOTE — Telephone Encounter (Signed)
We can do virtual visit tomorrow

## 2019-08-24 ENCOUNTER — Encounter: Payer: Self-pay | Admitting: Internal Medicine

## 2019-08-24 ENCOUNTER — Other Ambulatory Visit: Payer: Self-pay

## 2019-08-24 ENCOUNTER — Ambulatory Visit (INDEPENDENT_AMBULATORY_CARE_PROVIDER_SITE_OTHER): Payer: BC Managed Care – PPO | Admitting: Internal Medicine

## 2019-08-24 VITALS — Temp 97.6°F | Ht 67.0 in | Wt 162.0 lb

## 2019-08-24 DIAGNOSIS — J069 Acute upper respiratory infection, unspecified: Secondary | ICD-10-CM

## 2019-08-24 DIAGNOSIS — M7542 Impingement syndrome of left shoulder: Secondary | ICD-10-CM | POA: Diagnosis not present

## 2019-08-24 DIAGNOSIS — R197 Diarrhea, unspecified: Secondary | ICD-10-CM

## 2019-08-24 DIAGNOSIS — Z8619 Personal history of other infectious and parasitic diseases: Secondary | ICD-10-CM | POA: Diagnosis not present

## 2019-08-24 MED ORDER — HYDROCODONE-ACETAMINOPHEN 5-325 MG PO TABS: 1.0000 | ORAL_TABLET | Freq: Three times a day (TID) | ORAL | 0 refills | Status: DC | PRN

## 2019-08-24 NOTE — Patient Instructions (Signed)
Hydrocodone/APAP 5/325 to take sparingly every 8 hours as needed ear and sore throat pain.  To see gastroenterologist regarding persistent diarrhea status post treatment for C. difficile.  Consider allergy testing for recurrent respiratory infections.  Continue exercises for left shoulder impingement.

## 2019-08-24 NOTE — Progress Notes (Signed)
   Subjective:    Patient ID: Patrick Sanchez, male    DOB: 1961/01/31, 58 y.o.   MRN: [id number]  HPI patient was just here for health maintenance examination on November 13.  At that time he was complaining of some left shoulder pain and decreased range of motion in the left shoulder.  Thought to have impingement syndrome and was given an injection of Depo-Medrol Marcaine and Xylocaine.  Says shoulder pain improved a bit but then he worked with some neighbors to remove a dead tree from another neighbors yard.  Subsequently has come down with respiratory symptoms with cough congestion and scratchy throat.  Says his tonsils have been enlarged.  No documented fever.  No known COVID-19 exposure although these neighbors were working together in the yard but he felt they were socially distance.  Several did not have all mask but a few days had.  He has been taking some over-the-counter cold medication.  Complaining of head and ear congestion.  No fever.  He continues to have issues with diarrhea and has appointment with gastroenterologist soon.  Has been treated for C. difficile in August.  Diarrhea improved but never completely resolved.  I am reluctant to start him on more antibiotics with that history of C. difficile.  Prefer that he continue with over-the-counter cold medication.  He would like something for ear and throat pain.  He saw Dr. Erik Obey for ear and throat pain in September.  His hearing was checked and was essentially normal with very slight high-frequency drop-off.  Tympanograms were normal.  Patient was diagnosed with eustachian tube dysfunction and recurrent ear infections possible TMJ as well as throat and sinus infections.  He advised patient to use Flonase.  He has a longstanding history of allergic rhinitis, intermittent episodes of sinusitis and serous otitis media.  Has not had formal allergy testing in reviewing records for several years.  We may need to consider that in the near  future if symptoms persist.    Review of Systems     Objective:   Physical Exam  Reports that he is afebrile.  Is seen today via interactive audio and video telecommunications due to coronavirus pandemic.  He is agreeable to visit in this format today.  He is identified using 2 identifiers as Patrick Sanchez, a longstanding patient in this practice.  Reports that his shoulder pain improved with injection until he used a chain saw to help neighbors remove tree.  Now has shoulder pain once again.  If symptoms persist he may need to see orthopedist regarding impingement syndrome left shoulder.      Assessment & Plan:  Acute URI  Left shoulder impingement syndrome  History of C. difficile colitis status post treatment but with persistent diarrhea  Plan: Reluctant to give patient antibiotics with history of C. difficile.  He continues to have diarrhea and will be seeing gastroenterologist in the near future.  He was given small quantity of hydrocodone APAP 5/325 to help with ear pain sore throat pain and shoulder pain number 15 tablets with no refill may take sparingly every 8 hours.

## 2019-08-27 DIAGNOSIS — R197 Diarrhea, unspecified: Secondary | ICD-10-CM | POA: Diagnosis not present

## 2019-08-27 DIAGNOSIS — Z8619 Personal history of other infectious and parasitic diseases: Secondary | ICD-10-CM | POA: Diagnosis not present

## 2019-08-30 DIAGNOSIS — R197 Diarrhea, unspecified: Secondary | ICD-10-CM | POA: Diagnosis not present

## 2019-09-06 ENCOUNTER — Encounter: Payer: Self-pay | Admitting: Internal Medicine

## 2019-09-06 NOTE — Patient Instructions (Addendum)
Please see gastroenterologist regarding persistent diarrhea.  Watch diet.  Follow-up in January regarding elevated lipids.

## 2019-09-15 ENCOUNTER — Telehealth: Payer: Self-pay | Admitting: Internal Medicine

## 2019-09-15 NOTE — Telephone Encounter (Signed)
Please call in Zoloft  For 90 days with 3 refills

## 2019-09-15 NOTE — Telephone Encounter (Signed)
Patrick Sanchez - Pharmacist from [facility] called to see if Patrick Sanchez could get the below medication for 45 Day supply  Patrick Sanchez, Patrick Sanchez [phone] (Phone) [phone] (Fax)     sertraline (ZOLOFT) 100 MG tablet

## 2019-09-16 MED ORDER — SERTRALINE HCL 100 MG PO TABS: 100.0000 mg | ORAL_TABLET | Freq: Every day | ORAL | 3 refills | Status: DC

## 2019-09-24 DIAGNOSIS — R197 Diarrhea, unspecified: Secondary | ICD-10-CM | POA: Diagnosis not present

## 2019-10-04 ENCOUNTER — Other Ambulatory Visit: Payer: Self-pay

## 2019-10-04 MED ORDER — BUPROPION HCL ER (XL) 300 MG PO TB24: 300.0000 mg | ORAL_TABLET | Freq: Every day | ORAL | 3 refills | Status: AC

## 2019-10-13 ENCOUNTER — Telehealth: Payer: Self-pay | Admitting: Internal Medicine

## 2019-10-13 NOTE — Telephone Encounter (Signed)
Loomis Yarber [phone]  Bill called to say last Tuesday he took a friend home from the hospital that had fallen, she tested negative for COVID at hospital. Her son has since contacted him to let him know that she tested positive for COVID today after returning to [facility]. So he daughter is concerned he may have been exposed even though she was negative at the time he was with her. He also says that his insurance needs a order from his doctor that it is medical necessary for him to be tested. He is having no symptoms and would like a virtual visit to discuss.

## 2019-10-13 NOTE — Telephone Encounter (Signed)
I have spoken with Patrick Sanchez. Patrick Sanchez did not test positive in ED Dec 29 but has tested positive recently at nursing center. He feels well and has no complaints. His daughter wants him tested. He needs to see if he develops symptoms. It has been 8 days since he drove patient from ED to nursing center and  both he and [name] were wearing masks.

## 2019-10-14 DIAGNOSIS — Z03818 Encounter for observation for suspected exposure to other biological agents ruled out: Secondary | ICD-10-CM | POA: Diagnosis not present

## 2019-11-01 ENCOUNTER — Other Ambulatory Visit: Payer: Self-pay

## 2019-11-01 ENCOUNTER — Other Ambulatory Visit: Payer: BC Managed Care – PPO | Admitting: Internal Medicine

## 2019-11-01 MED ORDER — ATORVASTATIN CALCIUM 10 MG PO TABS: 10.0000 mg | ORAL_TABLET | Freq: Every day | ORAL | 1 refills | Status: DC

## 2019-11-04 ENCOUNTER — Ambulatory Visit: Payer: BC Managed Care – PPO | Admitting: Internal Medicine

## 2019-11-23 ENCOUNTER — Other Ambulatory Visit: Payer: Self-pay

## 2019-11-23 MED ORDER — ALLOPURINOL 300 MG PO TABS: ORAL_TABLET | ORAL | 3 refills | Status: DC

## 2019-11-29 ENCOUNTER — Other Ambulatory Visit: Payer: BC Managed Care – PPO | Admitting: Internal Medicine

## 2019-11-29 ENCOUNTER — Other Ambulatory Visit: Payer: Self-pay

## 2019-11-29 DIAGNOSIS — E782 Mixed hyperlipidemia: Secondary | ICD-10-CM | POA: Diagnosis not present

## 2019-11-29 LAB — LIPID PANEL
Cholesterol: 256 mg/dL — ABNORMAL HIGH (ref <200)
HDL: 62 mg/dL (ref >=40)
Non-HDL Cholesterol (Calc): 194 mg/dL (calc) — ABNORMAL HIGH (ref <130)
Total CHOL/HDL Ratio: 4.1 (calc) (ref <5.0)
Triglycerides: 624 mg/dL — ABNORMAL HIGH (ref <150)

## 2019-11-29 LAB — HEPATIC FUNCTION PANEL
AG Ratio: 1.6 (calc) (ref 1.0–2.5)
ALT: 40 U/L (ref 9–46)
AST: 30 U/L (ref 10–35)
Albumin: 4 g/dL (ref 3.6–5.1)
Alkaline phosphatase (APISO): 72 U/L (ref 35–144)
Bilirubin, Direct: 0.1 mg/dL (ref 0.0–0.2)
Globulin: 2.5 g/dL (calc) (ref 1.9–3.7)
Indirect Bilirubin: 0.5 mg/dL (calc) (ref 0.2–1.2)
Total Bilirubin: 0.6 mg/dL (ref 0.2–1.2)
Total Protein: 6.5 g/dL (ref 6.1–8.1)

## 2019-11-30 ENCOUNTER — Encounter: Payer: Self-pay | Admitting: Internal Medicine

## 2019-11-30 ENCOUNTER — Ambulatory Visit (INDEPENDENT_AMBULATORY_CARE_PROVIDER_SITE_OTHER): Payer: BC Managed Care – PPO | Admitting: Internal Medicine

## 2019-11-30 VITALS — Ht 67.0 in

## 2019-11-30 DIAGNOSIS — E782 Mixed hyperlipidemia: Secondary | ICD-10-CM

## 2019-11-30 NOTE — Patient Instructions (Signed)
Did not keep att to discuss lipid results

## 2019-11-30 NOTE — Progress Notes (Signed)
Patient did not kepp appt for follow up. Please call him. Was he fasting? Triglycerides are very high.

## 2019-12-06 ENCOUNTER — Other Ambulatory Visit: Payer: Self-pay

## 2019-12-06 ENCOUNTER — Encounter: Payer: Self-pay | Admitting: Internal Medicine

## 2019-12-06 ENCOUNTER — Ambulatory Visit (INDEPENDENT_AMBULATORY_CARE_PROVIDER_SITE_OTHER): Payer: BC Managed Care – PPO | Admitting: Internal Medicine

## 2019-12-06 VITALS — BP 110/80 | HR 83 | Temp 98.0°F | Ht 67.0 in | Wt 167.0 lb

## 2019-12-06 DIAGNOSIS — E782 Mixed hyperlipidemia: Secondary | ICD-10-CM | POA: Diagnosis not present

## 2019-12-06 DIAGNOSIS — Z131 Encounter for screening for diabetes mellitus: Secondary | ICD-10-CM

## 2019-12-06 NOTE — Progress Notes (Signed)
   Subjective:    Patient ID: Patrick Sanchez, male    DOB: 18-Oct-1960, 59 y.o.   MRN: [id number]  HPI 59 year old Male here for follow up on hyperlipidemia.He is on generic Lipitor 20 mg daily.  In November total cholesterol was 239 and triglycerides were 306.  HDL was 56.  LDL was 139.  Since that time patient has been drinking a lot of soft drinks containing caffeine and sugar.  Total cholesterol is now 256, HDL 62, triglycerides 624 and LDL could not be calculated due to the high triglyceride count.  He says he was fasting.  Says he has 3 or 7 alcoholic drinks a day.  I have screened him for diabetes today with hemoglobin A1c and also check TSH.  He saw gastroenterologist for persistent issues with diarrhea status post treatment for C. difficile and was placed on Colestid.  Still having some GI issues and may need to go back and see gastroenterologist.  He fell recently and had an injury to his left knee below his left patella.  Says it was slow to heal and became infected.  It looks to be healing now at the present time.  He is asking about increasing dose of Zoloft.  He is on 100 mg daily.  I think at this point, he should consider psychiatric consult.  His wife sees Dr. Chucky May.  It may be prudent to send him to cardiology lipid clinic depending on what we find with these labs drawn today.    Review of Systems     Objective:   Physical Exam Blood pressure 110/80 pulse 83 temperature 98 degrees orally pulse oximetry 97% BMI 26.16 weight 167 pounds  There is a small abrasion below his left patella that appears to be healing but may actually form a keloid as a result.  It does not appear to be infected       Assessment & Plan:  Significant hypertriglyceridemia.  In November triglycerides were 306 and now they are 624 and he denies that he was not fasting at the time they were drawn on February 22.  He has never had triglycerides this high.  History of mixed  hyperlipidemia-he did have triglycerides in the 300 range in 2017  Consider possibility of diabetes mellitus with high triglycerides and consuming a lot of sugary drinks.  Hemoglobin A1c drawn today.  Consider hypothyroidism-TSH drawn today  Persistent diarrhea status post treatment for C. difficile-May need to see gastroenterologist once again

## 2019-12-06 NOTE — Patient Instructions (Addendum)
TSH and hemoglobin A1c drawn for significant hypertriglyceridemia.  May need referral to lipid clinic.  May need to see gastroenterologist once again if diarrhea is persistent status post treatment for C. difficile.  May need psychiatric consultation for depression medication management

## 2019-12-07 LAB — HEMOGLOBIN A1C
Hgb A1c MFr Bld: 5.4 % of total Hgb (ref <5.7)
Mean Plasma Glucose: 108 (calc)
eAG (mmol/L): 6 (calc)

## 2019-12-07 LAB — TSH: TSH: 1.25 mIU/L (ref 0.40–4.50)

## 2019-12-08 ENCOUNTER — Other Ambulatory Visit: Payer: Self-pay

## 2019-12-08 DIAGNOSIS — E781 Pure hyperglyceridemia: Secondary | ICD-10-CM

## 2019-12-21 ENCOUNTER — Telehealth: Payer: Self-pay | Admitting: Internal Medicine

## 2019-12-21 NOTE — Telephone Encounter (Signed)
Patrick Sanchez called to say he had received the letter we sent him from his [employer] that he was not in compliance with taking his medication. So he wanted to be sure that we knew he is taking his atorvastatin (LIPITOR) 20 MG tablet as prescribed, that when it was time for a refill his wife's medication had been changed and she had a 90 day supply of this medication, so they decided for him to take that, instead of getting refill for same medication. He is now back on getting his on refills.

## 2019-12-23 ENCOUNTER — Ambulatory Visit: Payer: BC Managed Care – PPO | Attending: Internal Medicine

## 2019-12-23 DIAGNOSIS — Z23 Encounter for immunization: Secondary | ICD-10-CM

## 2019-12-23 NOTE — Progress Notes (Signed)
   Covid-19 Vaccination Clinic  Name:  Patrick Sanchez    MRN: [id number] DOB: [birth year]  12/23/2019  [name] was observed post Covid-19 immunization for 15 minutes without incident. He was provided with Vaccine Information Sheet and instruction to access the V-Safe system.   [name] was instructed to call 911 with any severe reactions post vaccine: Marland Kitchen Difficulty breathing  . Swelling of face and throat  . A fast heartbeat  . A bad rash all over body  . Dizziness and weakness   Immunizations Administered    Name Date Dose VIS Date Route   Pfizer COVID-19 Vaccine 12/23/2019  3:22 PM 0.3 mL 09/17/2019 Intramuscular   Manufacturer: [place]   Lot: [id number]   [place]: [id number]

## 2020-01-17 ENCOUNTER — Ambulatory Visit: Payer: BC Managed Care – PPO | Attending: Internal Medicine

## 2020-01-17 DIAGNOSIS — Z23 Encounter for immunization: Secondary | ICD-10-CM

## 2020-01-17 NOTE — Progress Notes (Signed)
   Covid-19 Vaccination Clinic  Name:  Patrick Sanchez    MRN: [id number] DOB: 05-20-1961  01/17/2020  Mr. [name] was observed post Covid-19 immunization for 15 minutes without incident. He was provided with Vaccine Information Sheet and instruction to access the V-Safe system.   Mr. [name] was instructed to call 911 with any severe reactions post vaccine: Marland Kitchen Difficulty breathing  . Swelling of face and throat  . A fast heartbeat  . A bad rash all over body  . Dizziness and weakness   Immunizations Administered    Name Date Dose VIS Date Route   Pfizer COVID-19 Vaccine 01/17/2020  3:50 PM 0.3 mL 09/17/2019 Intramuscular   Manufacturer: [place]   Lot: [id number]   [place]: [id number]

## 2020-02-21 ENCOUNTER — Telehealth: Payer: Self-pay | Admitting: Internal Medicine

## 2020-02-21 ENCOUNTER — Encounter: Payer: Self-pay | Admitting: Internal Medicine

## 2020-02-21 ENCOUNTER — Other Ambulatory Visit: Payer: Self-pay

## 2020-02-21 ENCOUNTER — Ambulatory Visit (INDEPENDENT_AMBULATORY_CARE_PROVIDER_SITE_OTHER): Payer: BC Managed Care – PPO | Admitting: Internal Medicine

## 2020-02-21 VITALS — BP 110/80 | HR 85 | Temp 97.7°F | Ht 67.0 in | Wt 161.0 lb

## 2020-02-21 DIAGNOSIS — M545 Low back pain, unspecified: Secondary | ICD-10-CM

## 2020-02-21 MED ORDER — MELOXICAM 15 MG PO TABS: 15.0000 mg | ORAL_TABLET | Freq: Every day | ORAL | 0 refills | Status: DC

## 2020-02-21 NOTE — Patient Instructions (Signed)
I am glad your back pain is improving.  Take meloxicam 15 mg daily for 7 to 10 days.  I do not think a back brace will be of help.  Avoid heavy lifting and vigorous shoveling for 10 days to 2 weeks.  If pain is not improving will refer him to physical therapy.

## 2020-02-21 NOTE — Telephone Encounter (Signed)
See at 4 pm today here

## 2020-02-21 NOTE — Telephone Encounter (Signed)
Scheduled

## 2020-02-21 NOTE — Telephone Encounter (Signed)
Can you please schedule him at 4pm

## 2020-02-21 NOTE — Telephone Encounter (Signed)
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 Please advise

## 2020-02-21 NOTE — Progress Notes (Signed)
   Subjective:    Patient ID: Quinci Gabrys, male    DOB: 02-12-1961, 59 y.o.   MRN: [id number]  HPI 59 year old Male has been doing some fairly heavy yard and landscape work involving shoveling at his home.  It seems that he has been doing a fair amount of shoveling over the past 10 days or so and strained his back last weekend.  This was concerning to him.  He tried anti-inflammatory medication with some relief.  Today he is feeling better but has been quite concerned about the severity of the pain and limited mobility.  No prior history of back issues.    Review of Systems complains of low bilateral back pain without radiculopathy.  No fever chills or dysuria.     Objective:   Physical Exam Blood pressure 110/80 pulse 85 temperature 97.7 pulse oximetry 97% weight 161 pounds height 5 feet 7 inches BMI 25.22 his hips are equal he is tender in his lower lumbar spine area bilaterally to palpation.  Range of motion of the trunk is fair with flexion and extension of the spine.  Straight leg raising is negative at 90 degrees bilaterally.  Muscle strength in the lower extremities is normal.  Deep tendon reflexes 2+ and symmetrical in the knees.       Assessment & Plan:  Acute low back pain-likely musculoskeletal without evidence of radiculopathy-has improved a bit over the weekend  Plan: Meloxicam 15 mg daily.  He is asking about a back brace and I do not think that would be of benefit.  Avoid shoveling and heavy lifting over the next week or so.  If symptoms do not improve he will need physical therapy.

## 2020-03-07 DIAGNOSIS — M545 Low back pain: Secondary | ICD-10-CM | POA: Diagnosis not present

## 2020-03-20 ENCOUNTER — Telehealth: Payer: Self-pay | Admitting: Internal Medicine

## 2020-03-20 DIAGNOSIS — M545 Low back pain: Secondary | ICD-10-CM | POA: Diagnosis not present

## 2020-03-20 NOTE — Telephone Encounter (Signed)
Received Fax RX request from  [facility] #[id number] [place], [place] - [place] Phone:  [phone]  Fax:  [phone]       Medication - meloxicam (MOBIC) 15 MG tablet   Last Refill - 02/21/20  Last OV - 5/17/1  Last CPE - 09/06/19  Next Appointment -

## 2020-03-20 NOTE — Telephone Encounter (Signed)
Needs to book CPE for November before refilling

## 2020-03-20 NOTE — Telephone Encounter (Signed)
Patient will have wife call us back to book both of their appointments.

## 2020-03-22 ENCOUNTER — Other Ambulatory Visit: Payer: Self-pay

## 2020-03-22 MED ORDER — MELOXICAM 15 MG PO TABS: 15.0000 mg | ORAL_TABLET | Freq: Every day | ORAL | 0 refills | Status: DC

## 2020-03-28 DIAGNOSIS — M545 Low back pain: Secondary | ICD-10-CM | POA: Diagnosis not present

## 2020-03-31 DIAGNOSIS — M545 Low back pain: Secondary | ICD-10-CM | POA: Diagnosis not present

## 2020-04-22 ENCOUNTER — Telehealth: Payer: Self-pay | Admitting: Internal Medicine

## 2020-04-22 DIAGNOSIS — E782 Mixed hyperlipidemia: Secondary | ICD-10-CM

## 2020-04-22 MED ORDER — ATORVASTATIN CALCIUM 10 MG PO TABS: 10.0000 mg | ORAL_TABLET | Freq: Every day | ORAL | 1 refills | Status: DC

## 2020-04-22 NOTE — Telephone Encounter (Signed)
Clarification of dose and med refill request from pharmacy: Patient apparently taling atorvastatin 10 mg daily and not 20 mg daily. Has CPE appt upcoming in November. Refill 10 mg dose x 6 months. Has significant mixed hyperlipidemia based on last lipid panel and may need specialty consult for management. I doubt 10 mg is enoough to normalize his lipids.

## 2020-04-27 ENCOUNTER — Telehealth: Payer: Self-pay | Admitting: Internal Medicine

## 2020-04-27 MED ORDER — MELOXICAM 15 MG PO TABS: 15.0000 mg | ORAL_TABLET | Freq: Every day | ORAL | 3 refills | Status: DC

## 2020-04-27 NOTE — Telephone Encounter (Signed)
Refill x 90 days 

## 2020-04-27 NOTE — Telephone Encounter (Signed)
Received Fax RX request from  [facility] #[id number] - [place], [place] - [place] Phone:  [phone]  Fax:  [phone]       Medication - meloxicam ([place]) 15 MG tablet   Last Refill - 03/22/2020  Last OV - 02/21/20  Last CPE - 08/20/19  Next Appointment - 08/22/2020

## 2020-05-12 ENCOUNTER — Telehealth: Payer: Self-pay | Admitting: Internal Medicine

## 2020-05-12 NOTE — Telephone Encounter (Signed)
Patrick Sanchez [phone]  [name] called to say he just got his Left point finger caught in an old timey spring loaded rat trap between middle of the finger and the knuckle of close the the hand. It did not break the skin, it is swelling and it hurts really bad. His first plan was to go and get a splint and take alterinate between meloxican and ibuprofen, and to put ice on it, his wife thought he should call and get advise from you.

## 2020-05-16 ENCOUNTER — Telehealth (INDEPENDENT_AMBULATORY_CARE_PROVIDER_SITE_OTHER): Payer: BC Managed Care – PPO | Admitting: Internal Medicine

## 2020-05-16 DIAGNOSIS — M79645 Pain in left finger(s): Secondary | ICD-10-CM | POA: Diagnosis not present

## 2020-05-16 DIAGNOSIS — S60022A Contusion of left index finger without damage to nail, initial encounter: Secondary | ICD-10-CM

## 2020-05-16 DIAGNOSIS — Z20828 Contact with and (suspected) exposure to other viral communicable diseases: Secondary | ICD-10-CM | POA: Diagnosis not present

## 2020-05-16 NOTE — Telephone Encounter (Signed)
Have called him

## 2020-05-16 NOTE — Telephone Encounter (Signed)
Patient called late this afternoon indicating that he had accidentally caught his left index finger in a rat trap that he was using outside of his home.  It was a spring-loaded device.  He has swelling of the index finger.  The accident did not break the skin.  He currently is at [facility] here in [place] trying to locate a splint while I am speaking with him and I am in my office.  I have checked his tetanus immunization and it is up-to-date having been given in 2018 and is next due in June 2028.  He was reassured by this.  He has some range of motion of the left index finger.  It is late in the afternoon in the office is closing.  He does not think his finger  is broken.  He does not think that he needs an x-ray or needs to go to the emergency department.  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  He has not been diagnosed with Aspergillosis nor has his wife.  It seems that the most likely has a contusion of his left index finger.  He is going to locate a finger splint and keep his finger splinted for several days.  Apply ice 24 hours to the left index finger.  He will call if not improving.  Suggest after wearing the splint for couple of 3 days that he remove it and do range of motion exercises.

## 2020-06-06 ENCOUNTER — Telehealth: Payer: Self-pay | Admitting: Internal Medicine

## 2020-06-06 NOTE — Telephone Encounter (Signed)
Patrick Sanchez [phone]  [name] called to say he has congestion, sinus, runny nose, no energy, scratchy throat, pressure in his ears and fatigued. Does not feel like he has COVID, He is scheduled for COVID booster next week.

## 2020-06-06 NOTE — Telephone Encounter (Signed)
After speaking with Dr Renold Genta scheduled a car visit for tomorrow to test for COVID, since he has so many symptoms

## 2020-06-07 ENCOUNTER — Encounter: Payer: Self-pay | Admitting: Internal Medicine

## 2020-06-07 ENCOUNTER — Telehealth: Payer: BC Managed Care – PPO | Admitting: Internal Medicine

## 2020-06-07 ENCOUNTER — Ambulatory Visit (INDEPENDENT_AMBULATORY_CARE_PROVIDER_SITE_OTHER): Payer: BC Managed Care – PPO | Admitting: Internal Medicine

## 2020-06-07 ENCOUNTER — Other Ambulatory Visit: Payer: Self-pay

## 2020-06-07 VITALS — Temp 97.7°F

## 2020-06-07 DIAGNOSIS — Z20822 Contact with and (suspected) exposure to covid-19: Secondary | ICD-10-CM

## 2020-06-07 DIAGNOSIS — J069 Acute upper respiratory infection, unspecified: Secondary | ICD-10-CM | POA: Diagnosis not present

## 2020-06-07 MED ORDER — AZITHROMYCIN 250 MG PO TABS: ORAL_TABLET | ORAL | 0 refills | Status: DC

## 2020-06-07 MED ORDER — HYDROCODONE-HOMATROPINE 5-1.5 MG/5ML PO SYRP: 5.0000 mL | ORAL_SOLUTION | Freq: Three times a day (TID) | ORAL | 0 refills | Status: DC | PRN

## 2020-06-07 NOTE — Progress Notes (Signed)
   Subjective:    Patient ID: Patrick Sanchez, male    DOB: Sep 05, 1961, 59 y.o.   MRN: [id number]  HPI 59 year old Male with nasal congestion and ear pressure. No known Covid-19 exposure. Has had Covid vaccines x 2 in March and April. He  thinks it may be allergy related but he is not not sure.No fever, chills, headache or dysgeusia.    Review of Systems no nausea, headache, vomiting or diarrhea.     Objective:   Physical Exam  Seen in no acute distress for visit in his auto outside my office due to Covid 19 pandemic. Covid-19 test obtained by nasal swab. He sounds nasally congested. He is afebrile.      Assessment & Plan:  Upper respiratory infection. Rule out covid-19 infection.  Plan: Covid-19 PCR test sent. Zithromax Z pak 2 po day 1 followed by one po days 2-5. Rest and drink fluids.  Addendum: Covid-19 PCR test is negative and patient was contacted with results.

## 2020-06-08 LAB — SARS-COV-2 RNA,(COVID-19) QUALITATIVE NAAT: SARS CoV2 RNA: NOT DETECTED

## 2020-06-13 ENCOUNTER — Ambulatory Visit: Payer: BC Managed Care – PPO | Attending: Internal Medicine

## 2020-06-13 DIAGNOSIS — Z23 Encounter for immunization: Secondary | ICD-10-CM

## 2020-06-13 NOTE — Progress Notes (Signed)
   Covid-19 Vaccination Clinic  Name:  Patrick Sanchez    MRN: [id number] DOB: 1961-09-20  06/13/2020  Patrick Sanchez was observed post Covid-19 immunization for 15 minutes without incident. He was provided with Vaccine Information Sheet and instruction to access the V-Safe system.   Patrick Sanchez was instructed to call 911 with any severe reactions post vaccine: Marland Kitchen Difficulty breathing  . Swelling of face and throat  . A fast heartbeat  . A bad rash all over body  . Dizziness and weakness

## 2020-06-15 ENCOUNTER — Telehealth: Payer: Self-pay | Admitting: Internal Medicine

## 2020-06-15 NOTE — Telephone Encounter (Signed)
error 

## 2020-06-22 ENCOUNTER — Other Ambulatory Visit: Payer: Self-pay | Admitting: Internal Medicine

## 2020-06-26 ENCOUNTER — Encounter: Payer: Self-pay | Admitting: Internal Medicine

## 2020-06-26 NOTE — Patient Instructions (Signed)
Rest and drink plenty of fluids.  19 PCR test obtained and results pending.  Take Zithromax Z-PAK 2 p.o. day 1 followed by 1 p.o. days 2 through 5.

## 2020-07-31 ENCOUNTER — Telehealth: Payer: Self-pay

## 2020-07-31 NOTE — Telephone Encounter (Signed)
Error

## 2020-08-02 ENCOUNTER — Other Ambulatory Visit: Payer: Self-pay | Admitting: Internal Medicine

## 2020-08-18 ENCOUNTER — Other Ambulatory Visit: Payer: Self-pay

## 2020-08-18 ENCOUNTER — Other Ambulatory Visit: Payer: BC Managed Care – PPO | Admitting: Internal Medicine

## 2020-08-18 DIAGNOSIS — F419 Anxiety disorder, unspecified: Secondary | ICD-10-CM

## 2020-08-18 DIAGNOSIS — E782 Mixed hyperlipidemia: Secondary | ICD-10-CM | POA: Diagnosis not present

## 2020-08-18 DIAGNOSIS — Z131 Encounter for screening for diabetes mellitus: Secondary | ICD-10-CM | POA: Diagnosis not present

## 2020-08-18 DIAGNOSIS — F32A Depression, unspecified: Secondary | ICD-10-CM | POA: Diagnosis not present

## 2020-08-18 DIAGNOSIS — R7309 Other abnormal glucose: Secondary | ICD-10-CM | POA: Diagnosis not present

## 2020-08-18 DIAGNOSIS — F439 Reaction to severe stress, unspecified: Secondary | ICD-10-CM

## 2020-08-18 DIAGNOSIS — Z0000 Encounter for general adult medical examination without abnormal findings: Secondary | ICD-10-CM

## 2020-08-18 DIAGNOSIS — Z125 Encounter for screening for malignant neoplasm of prostate: Secondary | ICD-10-CM

## 2020-08-18 DIAGNOSIS — E781 Pure hyperglyceridemia: Secondary | ICD-10-CM

## 2020-08-22 ENCOUNTER — Ambulatory Visit (INDEPENDENT_AMBULATORY_CARE_PROVIDER_SITE_OTHER): Payer: BC Managed Care – PPO | Admitting: Internal Medicine

## 2020-08-22 ENCOUNTER — Encounter: Payer: Self-pay | Admitting: Internal Medicine

## 2020-08-22 ENCOUNTER — Other Ambulatory Visit: Payer: Self-pay

## 2020-08-22 VITALS — BP 140/90 | HR 72 | Ht 67.0 in | Wt 168.0 lb

## 2020-08-22 DIAGNOSIS — J301 Allergic rhinitis due to pollen: Secondary | ICD-10-CM

## 2020-08-22 DIAGNOSIS — Z8659 Personal history of other mental and behavioral disorders: Secondary | ICD-10-CM

## 2020-08-22 DIAGNOSIS — Z8619 Personal history of other infectious and parasitic diseases: Secondary | ICD-10-CM

## 2020-08-22 DIAGNOSIS — Z1211 Encounter for screening for malignant neoplasm of colon: Secondary | ICD-10-CM

## 2020-08-22 DIAGNOSIS — G250 Essential tremor: Secondary | ICD-10-CM

## 2020-08-22 DIAGNOSIS — E782 Mixed hyperlipidemia: Secondary | ICD-10-CM

## 2020-08-22 DIAGNOSIS — D369 Benign neoplasm, unspecified site: Secondary | ICD-10-CM | POA: Diagnosis not present

## 2020-08-22 DIAGNOSIS — Z8739 Personal history of other diseases of the musculoskeletal system and connective tissue: Secondary | ICD-10-CM

## 2020-08-22 DIAGNOSIS — Z0000 Encounter for general adult medical examination without abnormal findings: Secondary | ICD-10-CM

## 2020-08-22 DIAGNOSIS — R059 Cough, unspecified: Secondary | ICD-10-CM | POA: Diagnosis not present

## 2020-08-22 DIAGNOSIS — Z8546 Personal history of malignant neoplasm of prostate: Secondary | ICD-10-CM

## 2020-08-22 LAB — COMPLETE METABOLIC PANEL WITH GFR
AG Ratio: 1.9 (calc) (ref 1.0–2.5)
ALT: 27 U/L (ref 9–46)
AST: 20 U/L (ref 10–35)
Albumin: 3.9 g/dL (ref 3.6–5.1)
Alkaline phosphatase (APISO): 83 U/L (ref 35–144)
BUN: 16 mg/dL (ref 7–25)
CO2: 23 mmol/L (ref 20–32)
Calcium: 8.9 mg/dL (ref 8.6–10.3)
Chloride: 100 mmol/L (ref 98–110)
Creat: 0.99 mg/dL (ref 0.70–1.33)
GFR, Est African American: 96 mL/min/{1.73_m2} (ref >=60)
GFR, Est Non African American: 83 mL/min/{1.73_m2} (ref >=60)
Globulin: 2.1 g/dL (calc) (ref 1.9–3.7)
Glucose, Bld: 104 mg/dL — ABNORMAL HIGH (ref 65–99)
Potassium: 3.9 mmol/L (ref 3.5–5.3)
Sodium: 137 mmol/L (ref 135–146)
Total Bilirubin: 0.3 mg/dL (ref 0.2–1.2)
Total Protein: 6 g/dL — ABNORMAL LOW (ref 6.1–8.1)

## 2020-08-22 LAB — LIPID PANEL
Cholesterol: 248 mg/dL — ABNORMAL HIGH (ref <200)
HDL: 58 mg/dL (ref >=40)
LDL Cholesterol (Calc): 139 mg/dL (calc) — ABNORMAL HIGH
Non-HDL Cholesterol (Calc): 190 mg/dL (calc) — ABNORMAL HIGH (ref <130)
Total CHOL/HDL Ratio: 4.3 (calc) (ref <5.0)
Triglycerides: 333 mg/dL — ABNORMAL HIGH (ref <150)

## 2020-08-22 LAB — CBC WITH DIFFERENTIAL/PLATELET
Absolute Monocytes: 828 cells/uL (ref 200–950)
Basophils Absolute: 112 cells/uL (ref 0–200)
Basophils Relative: 1.2 %
Eosinophils Absolute: 353 cells/uL (ref 15–500)
Eosinophils Relative: 3.8 %
HCT: 44.4 % (ref 38.5–50.0)
Hemoglobin: 15.2 g/dL (ref 13.2–17.1)
Lymphs Abs: 1776 cells/uL (ref 850–3900)
MCH: 32.1 pg (ref 27.0–33.0)
MCHC: 34.2 g/dL (ref 32.0–36.0)
MCV: 93.9 fL (ref 80.0–100.0)
MPV: 13.4 fL — ABNORMAL HIGH (ref 7.5–12.5)
Monocytes Relative: 8.9 %
Neutro Abs: 6231 cells/uL (ref 1500–7800)
Neutrophils Relative %: 67 %
Platelets: 178 10*3/uL (ref 140–400)
RBC: 4.73 10*6/uL (ref 4.20–5.80)
RDW: 12.2 % (ref 11.0–15.0)
Total Lymphocyte: 19.1 %
WBC: 9.3 10*3/uL (ref 3.8–10.8)

## 2020-08-22 LAB — TEST AUTHORIZATION

## 2020-08-22 LAB — PSA: PSA: <0.04 ng/mL (ref <=4.0)

## 2020-08-22 LAB — HEMOGLOBIN A1C W/OUT EAG: Hgb A1c MFr Bld: 5.7 % of total Hgb — ABNORMAL HIGH (ref <5.7)

## 2020-08-22 NOTE — Progress Notes (Signed)
Subjective:    Patient ID: Patrick Sanchez, male    DOB: 06-28-1961, 59 y.o.   MRN: [id number]  HPI  59 year old White Male presented to the office for the first time in November 25, 1988.  At that time, he was 59 years old and formally seen by Dr. Andris Baumann.  He came for a premarital exam with his fiance who was already an established patient here.  He has had 3 COVID-19 immunizations.  Tetanus immunization is up-to-date.  Subsequently he was allergy tested by Dr. Shaune Leeks in 1991 and had positive allergy skin test to dust, mattress dust, mite, cat, feathers, ragweed and slight reaction to molds.  He was treated with Seldane, Dimetapp, and Beconase AQ nasal spray by Dr. Shaune Leeks.  He has a longstanding history of GE reflux.  Diagnosed with duodenitis on upper GI series 1993.  He began seeing Dr. Watt Climes in the 1990s for reflux issues and was treated with PPI.  He began seeing Dr. Donneta Romberg for allergic rhinitis in December 2011.  Had positive skin test to dust mites, house dust, mold and also was diagnosed with extrinsic asthma.  History of hyperlipidemia treated with statin  Robotic assisted prostatectomy for prostate cancer 2016  History of gout treated with allopurinol  Right inguinal hernia repair 1972  Ganglion cyst removed from left wrist 1971  Vasectomy 1999  I&D thrombosed hemorrhoid 1997  Right lateral epicondylitis 2003  History of essential tremor which is longstanding treated with beta-blocker  Has been on antidepressants since approximately 2004 when a tree fell on their home creating situational stress  He had negative stress Cardiolite study in 2003 for cardiac assessment due to family history of coronary disease and also personal history of hyperlipidemia  Colonoscopy done 2017 with 2 tubular adenomas removed  Patient was diagnosed with C. difficile diarrhea in late August 2020.  Source of the infection was not clear, perhaps antibiotics taken earlier in  August or contaminated outdoor pool water.  Infection was slow to resolve.  Saw Dr. Watt Climes who placed him on Florastor, Zoloft and Colestid.  Patient had colonoscopy August 06, 2019 and colon biopsy showed no colitis or dysplasia.  Social history: Patient smoked for about 10 years but quit smoking in 1990.  Daily alcohol consumption.  Completed 2 years of college.  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.  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  Currently not working.  He is married.  Has 2 adult daughters living in [place].  Family history: Father with history of diabetes, MI, status post CABG.  Apparently father was bedridden at end-of-life due to heart failure.  Mother deceased from pancreatic cancer.  Mother had hypertension.  1 brother.  Has 1 sister with history of diabetes.    Review of Systems currently patient says he has a cough with some discolored sputum production.     Objective:   Physical Exam Blood pressure 140/90 pulse 72, regular weight 168 pounds, pulse ox 96% BMI 26.31  Skin: Warm and dry, no cervical adenopathy, TMs are clear bilaterally.  Neck is supple without JVD, carotid bruits or thyromegaly.  Chest is clear to auscultation.  Cardiac exam: Regular rate and rhythm normal S1 and S2 without murmurs.  Abdomen is soft nondistended without hepatosplenomegaly masses or tenderness.  Rectal exam: Prostate is absent.  Stool is guaiac negative.  Extremities without edema.  Neuro intact without  gross focal deficits.       Assessment & Plan:  History of GE reflux-PPI is not listed as a current medication at this time.  Reflux is a longstanding issue.  History of C. difficile colitis August 2020-resolved  History of allergic rhinitis-has been evaluated by allergist several years ago  History of prostate cancer status post robotic prostatectomy  Essential tremor treated with  propranolol  Complaint of cough with some slight sputum production-recommend chest x-ray  Hyperlipidemia treated with statin-both cholesterol and triglycerides elevated but may have missed doses of statin recently.  History of gout treated with allopurinol  Musculoskeletal pain treated with meloxicam  Depression stable treated with Zoloft and Wellbutrin  History of erectile dysfunction status post prostatectomy treated with Viagra by urologist  Impaired glucose tolerance-diet controlled  Labs are reviewed and are stable except for total cholesterol and triglycerides both of which are elevated but says he may have missed some doses of statin medication recently  Plan: Asked patient to take statin medication regularly.  Patient expresses desire for follow-up on possible home environmental exposures.  Pulmonary evaluation would be appropriate. Can make referral to [facility] if patient desires. Continue Inderal for essential tremor.  Continue antidepressants.  Have chest x-ray for evaluation of cough.  Continue allopurinol for history of gout.  May take meloxicam for musculoskeletal pain.  Suggest repeat lipid panel in a few weeks after taking statin regularly. Also, have spoken with Hulen Shouts, Insurance claims handler at [facility] who is sending some materials for patient and his wife to review.

## 2020-08-23 LAB — HEMOCCULT GUIAC POC 1CARD (OFFICE)
Card #1 Date: 11162021
Fecal Occult Blood, POC: NEGATIVE

## 2020-08-24 ENCOUNTER — Encounter: Payer: Self-pay | Admitting: Internal Medicine

## 2020-08-24 LAB — POCT URINALYSIS DIPSTICK
Appearance: NEGATIVE
Bilirubin, UA: NEGATIVE
Blood, UA: NEGATIVE
Glucose, UA: NEGATIVE
Ketones, UA: NEGATIVE
Leukocytes, UA: NEGATIVE
Nitrite, UA: NEGATIVE
Odor: NEGATIVE
Protein, UA: NEGATIVE
Spec Grav, UA: 1.01 (ref 1.010–1.025)
Urobilinogen, UA: 0.2 E.U./dL
pH, UA: 6.5 (ref 5.0–8.0)

## 2020-08-24 NOTE — Patient Instructions (Signed)
Please continue current medications including statin which needs to be taken regularly.  Suggest repeat lipid panel after the holidays.

## 2020-09-03 ENCOUNTER — Encounter: Payer: Self-pay | Admitting: Internal Medicine

## 2020-09-18 ENCOUNTER — Ambulatory Visit
Admission: RE | Admit: 2020-09-18 | Discharge: 2020-09-18 | Disposition: A | Discharge status: Home / Self Care | Payer: BC Managed Care – PPO | Source: Ambulatory Visit | Attending: Internal Medicine | Admitting: Internal Medicine

## 2020-09-18 ENCOUNTER — Other Ambulatory Visit: Payer: Self-pay

## 2020-09-18 ENCOUNTER — Telehealth: Payer: Self-pay

## 2020-09-18 ENCOUNTER — Telehealth: Payer: Self-pay | Admitting: Internal Medicine

## 2020-09-18 DIAGNOSIS — R059 Cough, unspecified: Secondary | ICD-10-CM

## 2020-09-18 DIAGNOSIS — R0602 Shortness of breath: Secondary | ICD-10-CM | POA: Diagnosis not present

## 2020-09-18 DIAGNOSIS — R9389 Abnormal findings on diagnostic imaging of other specified body structures: Secondary | ICD-10-CM

## 2020-09-18 NOTE — Telephone Encounter (Signed)
Chest xray results called in from [facility].  Focal airspace opacity anterior left base concerning for focal pneumonia. Lungs elsewhere clear. Heart size normal. No adenopathy evident.

## 2020-09-18 NOTE — Telephone Encounter (Signed)
Called patient. He has no symptoms of pneumonia. CXR was ordered several weeks ago because he was concerned about mold exposure so both of [place] are surprised at a focal density. I think he needs CT for further evaluation. Has not smoked in over 30 years.

## 2020-09-19 ENCOUNTER — Telehealth: Payer: Self-pay

## 2020-09-19 ENCOUNTER — Encounter: Payer: Self-pay | Admitting: Internal Medicine

## 2020-09-19 ENCOUNTER — Ambulatory Visit: Payer: BC Managed Care – PPO | Admitting: Internal Medicine

## 2020-09-19 ENCOUNTER — Telehealth: Payer: Self-pay | Admitting: Internal Medicine

## 2020-09-19 VITALS — BP 124/80 | HR 120 | Ht 67.0 in | Wt 172.0 lb

## 2020-09-19 DIAGNOSIS — R9389 Abnormal findings on diagnostic imaging of other specified body structures: Secondary | ICD-10-CM

## 2020-09-19 DIAGNOSIS — R059 Cough, unspecified: Secondary | ICD-10-CM

## 2020-09-19 DIAGNOSIS — R918 Other nonspecific abnormal finding of lung field: Secondary | ICD-10-CM | POA: Diagnosis not present

## 2020-09-19 LAB — CBC WITH DIFFERENTIAL/PLATELET
Absolute Monocytes: 710 cells/uL (ref 200–950)
Basophils Absolute: 100 cells/uL (ref 0–200)
Basophils Relative: 1.1 %
Eosinophils Absolute: 419 cells/uL (ref 15–500)
Eosinophils Relative: 4.6 %
HCT: 44.4 % (ref 38.5–50.0)
Hemoglobin: 15 g/dL (ref 13.2–17.1)
Lymphs Abs: 1820 cells/uL (ref 850–3900)
MCH: 31.6 pg (ref 27.0–33.0)
MCHC: 33.8 g/dL (ref 32.0–36.0)
MCV: 93.7 fL (ref 80.0–100.0)
MPV: 13.5 fL — ABNORMAL HIGH (ref 7.5–12.5)
Monocytes Relative: 7.8 %
Neutro Abs: 6052 cells/uL (ref 1500–7800)
Neutrophils Relative %: 66.5 %
Platelets: 196 10*3/uL (ref 140–400)
RBC: 4.74 10*6/uL (ref 4.20–5.80)
RDW: 12.6 % (ref 11.0–15.0)
Total Lymphocyte: 20 %
WBC: 9.1 10*3/uL (ref 3.8–10.8)

## 2020-09-19 MED ORDER — BENZONATATE 100 MG PO CAPS: 100.0000 mg | ORAL_CAPSULE | Freq: Three times a day (TID) | ORAL | 0 refills | Status: DC | PRN

## 2020-09-19 MED ORDER — DOXYCYCLINE HYCLATE 100 MG PO TABS: 100.0000 mg | ORAL_TABLET | Freq: Two times a day (BID) | ORAL | 0 refills | Status: DC

## 2020-09-19 NOTE — Telephone Encounter (Signed)
Spoke with patient. Do not think he should visit friend in long term care facility right now. He had a friend who is a radiologist look at CXR and thought it was pneumonia. Patient says he has been coughing for 2 months. Daughter is coming tomorrow from [place] to visit. Plan to see patient at 2 pm today.

## 2020-09-19 NOTE — Patient Instructions (Addendum)
Doxycycline 100 mg twice daily for 10 days.  Pulmonary consultation advised.  Rest and drink fluids.  Tessalon Perles if needed for cough.  CBC with differential obtained.

## 2020-09-19 NOTE — Telephone Encounter (Signed)
CT chest without contrast was approved.  #[id number].

## 2020-09-19 NOTE — Progress Notes (Signed)
   Subjective:    Patient ID: Patrick Sanchez, male    DOB: Oct 12, 1960, 59 y.o.   MRN: [id number]  HPI 60 year old Male seen today regarding abnormal chest x-ray.  He was seen November 16 for health maintenance exam.  History of robotic assisted prostatectomy for prostate cancer in 2016.  History of gout treated with allopurinol.  Longstanding history of GE reflux treated with PPI.  Essential tremor treated with propranolol  History of C. difficile colitis August 2020.  History of depression treated with Zoloft and Wellbutrin.  History of impaired glucose tolerance that is diet-controlled.  Has been concerned about environmental exposure in his home.  At his last visit, suggested he get a chest x-ray.  However the Thanksgiving holidays came and went and he did not go December 13.  He was found to have focal airspace opacity anterior left base concerning for focal pneumonia.  Lungs elsewhere were clear and heart size was normal.  No adenopathy was evident.  He has been concerned about mold exposure in his home and wondering if this is the reason for his abnormal chest x-ray.  He has had no fever or shaking chills recently.  Occasionally has slight discolored sputum production.  Denies sore throat.  No recent travel.  He did go to a nightclub recently to hear a band.  He has had 3 COVID-19 immunizations.      Review of Systems history of allergic rhinitis and was seen in past at [facility] in 2011 and had positive skin test to dust mites, house dust, mold and was diagnosed with extrinsic asthma.  He says that allergy injections were not recommended. Sometimes gets cough that is so deep that he gets nauseated.  He was tested by Dr. Shaune Leeks in 1991 and had positive allergy skin test to dust, mattress dust, mite, cat, feathers, ragweed and slight reaction to molds.  He was treated with Seldane, Dimetapp, Beconase AQ nasal spray by Dr. Shaune Leeks.     Objective:   Physical  Exam Today he appears in no acute distress does not appear to be tachypneic with very little cough. Blood pressure 124/80 pulse 120 pulse oximetry 94% weight 172 pounds BMI 26.94.  Seems anxious about his abnormal chest x-ray.  He has an essential tremor.  TMs and pharynx are clear.  Neck is supple without adenopathy.  Chest is clear to auscultation without rales or wheezing at all.      Assessment & Plan:  Abnormal chest x-ray with concern for focal pneumonia with focal airspace opacity anterior left base.  Essential tremor treated with propranolol  Reported mold exposure in his spine  Hyperlipidemia  Remote history of prostate cancer status post robotic prostatectomy  Hyperlipidemia treated with generic Lipitor  History of gout treated with Zyloprim  History of depression treated with Wellbutrin and Zoloft  Plan: He will be treated with doxycycline 100 mg twice daily for 10 days.  May take Tessalon Perles sparingly as needed for cough up to 3 times daily.  Rest and drink fluids.  Advised him to avoid visiting his elderly friend in retirement facility.  Recommend pulmonary consultation to try to sort out abnormal chest x-ray with history above.  He is agreeable to this.  I have also ordered a CT scan of his chest which he will have in a few days.  CBC with differential obtained.

## 2020-09-20 ENCOUNTER — Telehealth: Payer: Self-pay | Admitting: Internal Medicine

## 2020-09-20 NOTE — Telephone Encounter (Signed)
Patrick Sanchez [phone]  Bill called to ask since he has been diagnosed with PNA, that is probably from the mold in his home. Would it be alright to be around his friend that is coming in from out of town as long as he has his N95 mask on since pneumonia is not consider contagious to what he has read on the Internet.

## 2020-09-20 NOTE — Telephone Encounter (Signed)
I have asked Patrick Sanchez to isolate until we can get his CT. The advice I gave him yesterday about avoiding contact at nursing facility is no different than this. When is pulmonary consult?

## 2020-09-20 NOTE — Telephone Encounter (Signed)
Left detailed message that patient should isolate until CT.   Pulmonary appointment 10/19/2019 at [facility] patient he can call daily to see if they have any cancellations.

## 2020-09-22 ENCOUNTER — Ambulatory Visit
Admission: RE | Admit: 2020-09-22 | Discharge: 2020-09-22 | Disposition: A | Discharge status: Home / Self Care | Payer: BC Managed Care – PPO | Source: Ambulatory Visit | Attending: Internal Medicine | Admitting: Internal Medicine

## 2020-09-22 DIAGNOSIS — J9811 Atelectasis: Secondary | ICD-10-CM | POA: Diagnosis not present

## 2020-09-22 DIAGNOSIS — R918 Other nonspecific abnormal finding of lung field: Secondary | ICD-10-CM | POA: Diagnosis not present

## 2020-09-25 ENCOUNTER — Encounter: Payer: Self-pay | Admitting: Internal Medicine

## 2020-09-25 ENCOUNTER — Telehealth: Payer: Self-pay | Admitting: Internal Medicine

## 2020-09-25 NOTE — Telephone Encounter (Signed)
Called patient with results. There is atelectasis both bases but no infiltrate. No tumor noted. He has been fatigued and spending time lying down. He will begin walking more and taking deep breaths. Has appt with Pulmonary Jan 12th.

## 2020-09-26 NOTE — Telephone Encounter (Signed)
Patient c/o congestion, cough coughing up clear, and some SOB. He would like to know if you can prescribe something to help relieve some symptoms, he said he went to the [facility] last night and he got shortness of breath.

## 2020-09-26 NOTE — Telephone Encounter (Signed)
Doxycycline was prescribed recently and he should still be on it until December 24 by my notes

## 2020-10-04 ENCOUNTER — Other Ambulatory Visit: Payer: Self-pay | Admitting: Internal Medicine

## 2020-10-11 ENCOUNTER — Telehealth: Payer: Self-pay | Admitting: Internal Medicine

## 2020-10-11 NOTE — Telephone Encounter (Signed)
Patrick Sanchez [phone]  Bill called to see if we did rapid COVID testing, I let him know we do not. We only have COVID test that take at least 48-72 hours to come back and sometime longer depending on how long many people are being tested and we are only testing patient that are having symptoms. He then stated he has been congested for awhile and he is tired, and when he goes walking his 40 stats goes into the 80's. I let him know if they stay in the 80's he should go to emergency room that this is not normal, he also said he has an appointment with pulmonary next week.  He then also stated that he was going to try a local pharmacy because he wants a rapid test so he can visit his friend at nursing home.

## 2020-10-16 ENCOUNTER — Telehealth: Payer: Self-pay

## 2020-10-16 NOTE — Telephone Encounter (Signed)
Does patient want to be seen? Please call him tomorrow and see how he is doing.

## 2020-10-16 NOTE — Telephone Encounter (Signed)
Patient called back he said he does think you can help him and he has made an appointment with Dr. Monika Salk and he repeated that he doesn't think you can help and then he started crying bc said he is going through a lot.
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
## 2020-10-17 DIAGNOSIS — F4322 Adjustment disorder with anxiety: Secondary | ICD-10-CM | POA: Diagnosis not present

## 2020-10-17 NOTE — Telephone Encounter (Signed)
Called patient to check on him he said he does not want to come in. He said he had an appointment with a counselor yesterday.

## 2020-10-18 ENCOUNTER — Other Ambulatory Visit: Payer: Self-pay

## 2020-10-18 ENCOUNTER — Ambulatory Visit (INDEPENDENT_AMBULATORY_CARE_PROVIDER_SITE_OTHER): Payer: BC Managed Care – PPO | Admitting: Pulmonary Disease

## 2020-10-18 ENCOUNTER — Encounter: Payer: Self-pay | Admitting: Pulmonary Disease

## 2020-10-18 VITALS — BP 130/82 | HR 120 | Temp 98.2°F | Ht 67.0 in | Wt 170.0 lb

## 2020-10-18 DIAGNOSIS — R059 Cough, unspecified: Secondary | ICD-10-CM

## 2020-10-18 DIAGNOSIS — R0600 Dyspnea, unspecified: Secondary | ICD-10-CM | POA: Diagnosis not present

## 2020-10-18 MED ORDER — ALBUTEROL SULFATE HFA 108 (90 BASE) MCG/ACT IN AERS
2.0000 | INHALATION_SPRAY | Freq: Four times a day (QID) | RESPIRATORY_TRACT | 6 refills | Status: AC | PRN
Start: 2020-10-18 — End: ?

## 2020-10-18 NOTE — Progress Notes (Signed)
Synopsis: Referred in 10/2020 for abnormal chest radiograph  Subjective:   PATIENT ID: Patrick Sanchez GENDER: male DOB: December 06, 1960, MRN: [id number]   HPI  Chief Complaint  Patient presents with  . Consult    Referred by PCP for abnormal CXR and CT from December 2022. Symptoms of DOE and chest congestion with clear phlegm. Was recently told that he has mold in his house.    Patrick Sanchez is a 60 year old male, former smoker who is referred to pulmonary clinic for abnormal chest imaging.   He reports having minor respiratory symptoms for a good while and over the past 6 months he states his respiratory symptoms have become major. He has chest congestion that is greatest in the morning time and he has coughing episodes that cause him to feel nauseated. He coughs up clear mucous. He denies sinus congestion or drainage. He does have history of GERD. He reports drinking alcohol on a nightly basis. He reports exertional dyspnea as well. He has noticed a significant decline in how far he can walk his dog.   They have had air quality testing performed in their home with reported high particulate matter as they noticed mold issues due to some water damage from an upstairs dormer and also an improperly vented air duct into their attic. They now have molecule air purifiers in the house. The house was built in the 1940s and he has lived there for 30 years. They had renovations done in the 2016 which possibly led to the leaking dormer and vent issues.   He smoked for 14-15 years where he smoked over a pack per day and he quit 31 years ago.    Past Medical History:  Diagnosis Date  . Allergic rhinitis   . Allergy   . Depression   . Fatty tumor    right upper arm - no surgery to remove  . Frozen shoulder    right  . GERD (gastroesophageal reflux disease)   . Hyperlipidemia   . Knee pain    LEFT- healed with no issues as og 01-26-16  . Neuromuscular disorder (HCC)    tremor  . Prostate  cancer ([place])   . Psoriasis   . Tremor, essential    resting  . Urinary hesitancy    "SHY BLADDER"     Family History  Problem Relation Age of Onset  . Heart failure Father   . Cancer Mother   . Colon cancer Neg Hx   . Colon polyps Neg Hx      Social History   Socioeconomic History  . Marital status: Married    Spouse name: Not on file  . Number of children: Not on file  . Years of education: Not on file  . Highest education level: Not on file  Occupational History  . Not on file  Tobacco Use  . Smoking status: Former Smoker    Types: Cigarettes    Quit date: 03/26/1989    Years since quitting: 31.5  . Smokeless tobacco: Former Systems developer  . Tobacco comment: long ago with no regularity   Substance and Sexual Activity  . Alcohol use: Yes    Alcohol/week: 7.0 standard drinks    Types: 7 Standard drinks or equivalent per week    Comment: 3 drinks at night   . Drug use: No  . Sexual activity: Not on file  Other Topics Concern  . Not on file  Social History Narrative  . Not on file  Social Determinants of Health   Financial Resource Strain: Not on file  Food Insecurity: Not on file  Transportation Needs: Not on file  Physical Activity: Not on file  Stress: Not on file  Social Connections: Not on file  Intimate Partner Violence: Not on file     Allergies  Allergen Reactions  . Sulfamethoxazole     unsure  . Sulfa Antibiotics Rash     Outpatient Medications Prior to Visit  Medication Sig Dispense Refill  . allopurinol (ZYLOPRIM) 300 MG tablet TAKE 1 TABLET(300 MG) BY MOUTH DAILY 90 tablet 2  . atorvastatin (LIPITOR) 10 MG tablet TAKE 1 TABLET(10 MG) BY MOUTH DAILY 90 tablet 1  . benzonatate (TESSALON) 100 MG capsule Take 1 capsule (100 mg total) by mouth 3 (three) times daily as needed for cough. 30 capsule 0  . buPROPion (WELLBUTRIN XL) 300 MG 24 hr tablet Take 1 tablet (300 mg total) by mouth daily. 90 tablet 3  . ibuprofen (ADVIL,MOTRIN) 200 MG tablet Take  800 mg by mouth every 6 (six) hours as needed.    Marland Kitchen ketoconazole (NIZORAL) 2 % cream Apply 1 application topically daily. Apply to foot daily x 3 weeks. 15 g 0  . meloxicam (MOBIC) 15 MG tablet Take 1 tablet (15 mg total) by mouth daily. With a meal. 30 tablet 3  . propranolol (INDERAL) 20 MG tablet TAKE ONE TABLET DAILY FOR TREMORS 90 tablet 3  . sertraline (ZOLOFT) 100 MG tablet TAKE 1 TABLET(100 MG) BY MOUTH DAILY 90 tablet 3  . sildenafil (VIAGRA) 50 MG tablet Take 50 mg by mouth daily as needed for erectile dysfunction.    Marland Kitchen doxycycline (VIBRA-TABS) 100 MG tablet Take 1 tablet (100 mg total) by mouth 2 (two) times daily. 20 tablet 0   No facility-administered medications prior to visit.    Review of Systems  Constitutional: Negative for chills, fever, malaise/fatigue and weight loss.  HENT: Negative for congestion, sinus pain and sore throat.   Eyes: Negative.   Respiratory: Positive for cough, sputum production and shortness of breath. Negative for hemoptysis and wheezing.   Cardiovascular: Negative for chest pain, palpitations, orthopnea, claudication and leg swelling.  Gastrointestinal: Negative for abdominal pain, diarrhea, heartburn, nausea and vomiting.  Genitourinary: Negative.   Musculoskeletal: Negative.   Skin: Negative for rash.  Neurological: Negative.   Endo/Heme/Allergies: Negative.   Psychiatric/Behavioral: Negative.    Objective:   Vitals:   10/18/20 1613  BP: 130/82  Pulse: (!) 120  Temp: 98.2 F (36.8 C)  TempSrc: Temporal  SpO2: 97%  Weight: 170 lb (77.1 kg)  Height: 5\' 7"  (1.702 m)     Physical Exam Constitutional:      General: He is not in acute distress. HENT:     Head: Normocephalic and atraumatic.  Eyes:     General: No scleral icterus.    Conjunctiva/sclera: Conjunctivae normal.     Pupils: Pupils are equal, round, and reactive to light.  Cardiovascular:     Rate and Rhythm: Normal rate and regular rhythm.     Pulses: Normal pulses.      Heart sounds: Normal heart sounds. No murmur heard.   Pulmonary:     Effort: Pulmonary effort is normal.     Breath sounds: Decreased breath sounds present. No wheezing, rhonchi or rales.  Abdominal:     General: Bowel sounds are normal.     Palpations: Abdomen is soft.  Musculoskeletal:     Right lower leg: No edema.  Left lower leg: No edema.  Skin:    General: Skin is warm and dry.  Neurological:     General: No focal deficit present.     Mental Status: He is alert.  Psychiatric:        Mood and Affect: Mood normal.        Behavior: Behavior normal.        Thought Content: Thought content normal.        Judgment: Judgment normal.    CBC    Component Value Date/Time   WBC 9.1 09/19/2020 1442   RBC 4.74 09/19/2020 1442   HGB 15.0 09/19/2020 1442   HCT 44.4 09/19/2020 1442   PLT 196 09/19/2020 1442   MCV 93.7 09/19/2020 1442   MCH 31.6 09/19/2020 1442   MCHC 33.8 09/19/2020 1442   RDW 12.6 09/19/2020 1442   LYMPHSABS 1,820 09/19/2020 1442   MONOABS 406 03/11/2017 1130   EOSABS 419 09/19/2020 1442   BASOSABS 100 09/19/2020 1442    BMP Latest Ref Rng & Units 08/18/2020 08/17/2019 06/17/2019  Glucose 65 - 99 mg/dL 104(H) 81 100(H)  BUN 7 - 25 mg/dL 16 19 13   Creatinine 0.70 - 1.33 mg/dL 0.99 0.91 1.08  BUN/Creat Ratio 6 - 22 (calc) NOT APPLICABLE NOT APPLICABLE NOT APPLICABLE  Sodium A999333 - 146 mmol/L 137 138 143  Potassium 3.5 - 5.3 mmol/L 3.9 4.2 4.2  Chloride 98 - 110 mmol/L 100 102 104  CO2 20 - 32 mmol/L 23 25 27   Calcium 8.6 - 10.3 mg/dL 8.9 9.1 10.0   Chest imaging: CT Chest 09/22/20 There is no pneumothorax. There is atelectasis at the lung bases vs reticulation. There is no focal infiltrate. No pleural effusion.  CXR 09/18/20 Ill-defined airspace opacity is noted in the anterior left base. Lungs elsewhere are clear. Heart size and pulmonary vascular normal. No adenopathy. No bone lesions.  PFT: No flowsheet data found.    Assessment & Plan:    Dyspnea, unspecified type - Plan: Pulmonary Function Test  Cough - Plan: Hypersensitivity pnuemonitis profile, IgE, CBC with Differential  Discussion: Lazer Meloche is a 60 year old male, former smoker who is referred to pulmonary clinic for abnormal chest imaging and for evaluation of cough and exertional dyspnea.   He had a chest radiograpn on 09/18/20 which was concerning for an ill defined airspace opacity of the left base in which a CT chest was obtained where there is bibasilar subpleural atelectasis vs early reticulation.   Given his progressive dyspnea and cough we will check pulmonary function testing. He does report a water/mold issue in the home with rising absolute eosinophil counts on serial CBC count differentials. We will check a CBC with differential, IgE level and hypsensitivity pneumonitis panel. We will prescribe him an albuterol inhaler and monitor for any symptom improvement.   He also reports multiple alcoholic drinks in the evening time which could predispose him to GERD and possible secondary airway irritation.   Follow up in 2 months.  Freda Jackson, MD [facility] Office: [phone]   See Amion for Pager Details    Current Outpatient Medications:  .  albuterol (VENTOLIN HFA) 108 (90 Base) MCG/ACT inhaler, Inhale 2 puffs into the lungs every 6 (six) hours as needed for wheezing or shortness of breath., Disp: 8 g, Rfl: 6 .  allopurinol (ZYLOPRIM) 300 MG tablet, TAKE 1 TABLET(300 MG) BY MOUTH DAILY, Disp: 90 tablet, Rfl: 2 .  atorvastatin (LIPITOR) 10 MG tablet, TAKE 1  TABLET(10 MG) BY MOUTH DAILY, Disp: 90 tablet, Rfl: 1 .  benzonatate (TESSALON) 100 MG capsule, Take 1 capsule (100 mg total) by mouth 3 (three) times daily as needed for cough., Disp: 30 capsule, Rfl: 0 .  buPROPion (WELLBUTRIN XL) 300 MG 24 hr tablet, Take 1 tablet (300 mg total) by mouth daily., Disp: 90 tablet, Rfl: 3 .  ibuprofen (ADVIL,MOTRIN) 200 MG tablet, Take  800 mg by mouth every 6 (six) hours as needed., Disp: , Rfl:  .  ketoconazole (NIZORAL) 2 % cream, Apply 1 application topically daily. Apply to foot daily x 3 weeks., Disp: 15 g, Rfl: 0 .  meloxicam (MOBIC) 15 MG tablet, Take 1 tablet (15 mg total) by mouth daily. With a meal., Disp: 30 tablet, Rfl: 3 .  propranolol (INDERAL) 20 MG tablet, TAKE ONE TABLET DAILY FOR TREMORS, Disp: 90 tablet, Rfl: 3 .  sertraline (ZOLOFT) 100 MG tablet, TAKE 1 TABLET(100 MG) BY MOUTH DAILY, Disp: 90 tablet, Rfl: 3 .  sildenafil (VIAGRA) 50 MG tablet, Take 50 mg by mouth daily as needed for erectile dysfunction., Disp: , Rfl:

## 2020-10-18 NOTE — Patient Instructions (Signed)
Start albuterol inhaler 1-2 puffs every 4-6 hours as needed for cough, shortness of breath or wheezing  We will check lab work today and schedule you for pulmonary function testing in the near future.   Please email your air quality reports to: [email or web address]

## 2020-10-19 LAB — CBC WITH DIFFERENTIAL/PLATELET
Basophils Absolute: 0.1 10*3/uL (ref 0.0–0.1)
Basophils Relative: 1.2 % (ref 0.0–3.0)
Eosinophils Absolute: 0.3 10*3/uL (ref 0.0–0.7)
Eosinophils Relative: 3.2 % (ref 0.0–5.0)
HCT: 49.3 % (ref 39.0–52.0)
Hemoglobin: 16.2 g/dL (ref 13.0–17.0)
Lymphocytes Relative: 20.2 % (ref 12.0–46.0)
Lymphs Abs: 1.9 10*3/uL (ref 0.7–4.0)
MCHC: 32.8 g/dL (ref 30.0–36.0)
MCV: 94.9 fl (ref 78.0–100.0)
Monocytes Absolute: 0.8 10*3/uL (ref 0.1–1.0)
Monocytes Relative: 8.4 % (ref 3.0–12.0)
Neutro Abs: 6.2 10*3/uL (ref 1.4–7.7)
Neutrophils Relative %: 67 % (ref 43.0–77.0)
Platelets: 193 10*3/uL (ref 150.0–400.0)
RBC: 5.19 Mil/uL (ref 4.22–5.81)
RDW: 13.4 % (ref 11.5–15.5)
WBC: 9.3 10*3/uL (ref 4.0–10.5)

## 2020-10-23 LAB — HYPERSENSITIVITY PNUEMONITIS PROFILE
ASPERGILLUS FUMIGATUS: NEGATIVE
ASPERGILLUS FUMIGATUS: NEGATIVE
Faenia retivirgula: NEGATIVE
Faenia retivirgula: NEGATIVE
Pigeon Serum: NEGATIVE
Pigeon Serum: NEGATIVE
S. VIRIDIS: NEGATIVE
S. VIRIDIS: NEGATIVE
T. CANDIDUS: NEGATIVE
T. CANDIDUS: NEGATIVE
T. VULGARIS: NEGATIVE
T. VULGARIS: NEGATIVE

## 2020-10-23 LAB — IGE: IgE (Immunoglobulin E), Serum: 22 kU/L (ref <=114)

## 2020-10-25 DIAGNOSIS — F4323 Adjustment disorder with mixed anxiety and depressed mood: Secondary | ICD-10-CM | POA: Diagnosis not present

## 2020-11-15 ENCOUNTER — Telehealth: Payer: Self-pay | Admitting: Pulmonary Disease

## 2020-11-15 MED ORDER — PANTOPRAZOLE SODIUM 40 MG PO TBEC: 40.0000 mg | DELAYED_RELEASE_TABLET | Freq: Every day | ORAL | 3 refills | Status: DC

## 2020-11-15 MED ORDER — FLOVENT HFA 110 MCG/ACT IN AERO: 2.0000 | INHALATION_SPRAY | Freq: Two times a day (BID) | RESPIRATORY_TRACT | 4 refills | Status: DC

## 2020-11-15 NOTE — Telephone Encounter (Signed)
Spoke with patient. He is interested in the Flovent inhaler. He verbalized understanding of instructions.   He stated that he is already taking an OTC medication for gerd and feels this is not related to his cough but he will start the pantoprazole 40mg . Also stated that he is taking Mucinex daily.   I advised him to try the Flovent for a few days to see if this would help. He is aware to call us back if he has not noticed any improvement.

## 2020-11-15 NOTE — Telephone Encounter (Signed)
If he has noticed benefit from the albuterol inhaler, he can be tried on flovent 162mcg 2 puffs twice daily (rinse mouth out afterwards). If he has not benefited from albuterol, then don't send in flovent.   His cough is also likely secondary to reflux disease. He should be started on pantoprazole 40mg  daily with 3 refills and instruct him to not eat anything or drink alcohol 2 hours prior to bedtime. Please make him aware that alcohol can make reflux disease worse.   He can take mucinex DM as needed for the cough and mucous.   Thanks, Wille Glaser

## 2020-11-15 NOTE — Telephone Encounter (Signed)
Spoke with patient. He stated that he is still dealing with the mold removal in his house. He has noticed over the last week that his chest congestion has increased. He is now coughing up phlegm that ranges from yellow to green. Has not noticed any nasal discharge. Has not noticed a change with his SOB. He is using his albuterol inhaler 2 puffs every 6 hrs as needed. Denies any fevers or chest pain. Did state he had a nasty fall a few days ago so he is still sore from the fall so its hard for him to determine if he has any new body aches.   He stated that he was tested for covid last week and his results were negative.   Pharmacy is [facility] on [address].   JD, can you please advise? Thanks!

## 2020-11-20 ENCOUNTER — Telehealth: Payer: Self-pay | Admitting: Pulmonary Disease

## 2020-11-20 NOTE — Telephone Encounter (Signed)
Needs visit in person to sort. Symptoms have been present for months. Would recommend he see his primary pulmonologist, if nothing available in the next 4 weeks ok to schedule with APP.

## 2020-11-20 NOTE — Telephone Encounter (Signed)
Attempted to call pt but unable to reach. Left message for him to return call. °

## 2020-11-20 NOTE — Telephone Encounter (Signed)
Pt returned call. Please advise [phone]

## 2020-11-20 NOTE — Telephone Encounter (Signed)
Left message for patient to call back  

## 2020-11-20 NOTE — Telephone Encounter (Signed)
Lm x1 for patient.  

## 2020-11-20 NOTE — Telephone Encounter (Signed)
Please see 11/15/2020 phone note.  Patient reports of persistent chest congestion, productive cough with thick green sputum, sob and wheezing. Sx have been present for months. He is using flovent BID, albuterol 3-4x daily, zyrtec and mucinex 1200mg  with no relief.  He has black mold in his home. He is not able to treat this issue at this time. He stated that he is unable to function with his symptoms.  Denied f/c/s. He had negative home covid test last week.   He has had all three covid vaccines booster and flu shot.   Beth, please advise. Thanks

## 2020-11-21 ENCOUNTER — Telehealth: Payer: Self-pay | Admitting: Internal Medicine

## 2020-11-21 NOTE — Telephone Encounter (Signed)
Patrick Sanchez [phone]  Bill called today to say his is dissatisfied with the pulmonary office that we referred him to and he would like for you to refer him some where else.

## 2020-11-21 NOTE — Telephone Encounter (Signed)
LMTCB

## 2020-11-21 NOTE — Telephone Encounter (Signed)
Called and spoke with pt letting him know that E[place]io Maize wants [place] to schedule him an appt with Dr. Erin Fulling and he verbalized understanding. appt made for pt with JD Monday 2/21. Nothing further needed.

## 2020-11-21 NOTE — Telephone Encounter (Signed)
Pt is calling back & asks to speak with [facility] directly.  Pt states he can be reached at his # on file, [phone]

## 2020-11-22 ENCOUNTER — Telehealth: Payer: Self-pay | Admitting: Internal Medicine

## 2020-11-22 ENCOUNTER — Encounter: Payer: Self-pay | Admitting: Internal Medicine

## 2020-11-22 NOTE — Telephone Encounter (Signed)
See phone note Feb. 16

## 2020-11-22 NOTE — Telephone Encounter (Signed)
Patient called yesterday. Mariann Laster, our front Glass blower/designer, spoke with him. I called him back today. Has been frustrated with lack of symptom improvement. Says he is coughing up green sputum with black flecks in it. Told him he may need sputum culture and Gram stain collected in sterile container. He has appt with Pulmonary physician next week. Says that speaking with Pulmonary office staff is frustrating and he can overhear other conversations regarding other patients when he is calling. Says he reported this to office manager. He says his wife has been following his lab work for some time and thinks there has been significant changes in some of his labs.He did not specify which labs are of concern to her. She is out of town presently. He does understand Pulmonary uses a different lab from ours which is [place] he is using an air purifier at home but feels there is still significant contamination in his home.

## 2020-11-24 ENCOUNTER — Telehealth: Payer: Self-pay

## 2020-11-24 NOTE — Telephone Encounter (Signed)
Pharmacy name: [facility] Drug requested: pantoprazole 40mg  CMM?: yes Key: [id number] Covered alternatives: omeprazole, esomeprazole Tried and failed: on file Decision: approved - 11/23/21

## 2020-11-27 ENCOUNTER — Encounter: Payer: Self-pay | Admitting: Pulmonary Disease

## 2020-11-27 ENCOUNTER — Other Ambulatory Visit: Payer: Self-pay

## 2020-11-27 ENCOUNTER — Ambulatory Visit (INDEPENDENT_AMBULATORY_CARE_PROVIDER_SITE_OTHER): Payer: BC Managed Care – PPO | Admitting: Pulmonary Disease

## 2020-11-27 ENCOUNTER — Telehealth: Payer: Self-pay | Admitting: Hematology and Oncology

## 2020-11-27 VITALS — BP 144/86 | HR 111 | Temp 98.3°F | Ht 67.0 in | Wt 163.0 lb

## 2020-11-27 DIAGNOSIS — R059 Cough, unspecified: Secondary | ICD-10-CM | POA: Diagnosis not present

## 2020-11-27 DIAGNOSIS — K219 Gastro-esophageal reflux disease without esophagitis: Secondary | ICD-10-CM | POA: Diagnosis not present

## 2020-11-27 NOTE — Telephone Encounter (Signed)
Please see email from pt and attachments   This message is being sent by Patrick Sanchez on behalf of Patrick Sanchez.  Attached are the 2 documents I emailed to you that you didn't receive. I had hoped to discuss these so please let me know your observations  Patrick Sanchez

## 2020-11-27 NOTE — Telephone Encounter (Signed)
Received a new hem referral from Dr. Renold Genta for  concerns about white blood count and differential. Pt has been scheduled to see Dr. Chryl Heck on 3/7 at 1120am. Appt date and time has been given to the pt's wife. Aware to arrive 20 minutes early.

## 2020-11-27 NOTE — Progress Notes (Signed)
Synopsis: Referred in 10/2020 for abnormal chest radiograph  Subjective:   PATIENT ID: Patrick Sanchez GENDER: male DOB: [birth year], MRN: [id number]  HPI  Chief Complaint  Patient presents with  . Follow-up    Productive cough with extreme fatigue. Not thinking clearly   Patrick Sanchez is a 60 year old male, former smoker who returns to pulmonary clinic for chronic productive cough.  He reports his health is worse than before. He continues to have cough with thick green sputum production with occasional black specs. He has not noticed benefit with the flovent 167mcg 2 puffs twice a daily inhaler.   He has not started his pantoprazole based on insurance authorization. He reports his prescription has been approved and should be starting it soon. He is not able to elevate the head of his bed as it is a water bed. He has had issues with GERD since he was 89. He reports drinking 6-7 alcoholic beverages daily. He has seen Dr. Watt Climes previously in GI.     CT Chest 09/22/20 does not show interstitial involvement or opacities. No effusions or airway issues. Hypersensitivity panel was sent and is negative.   He remains concerned that the mold issue in his home is the cause of his health issues.  OV 08/18/21: He reports having minor respiratory symptoms for a good while and over the past 6 months he states his respiratory symptoms have become major. He has chest congestion that is greatest in the morning time and he has coughing episodes that cause him to feel nauseated. He coughs up clear mucous. He denies sinus congestion or drainage. He does have history of GERD. He reports drinking alcohol on a nightly basis. He reports exertional dyspnea as well. He has noticed a significant decline in how far he can walk his dog.   They have had air quality testing performed in their home with reported high particulate matter as they noticed mold issues due to some water damage from an upstairs dormer and  also an improperly vented air duct into their attic. They now have molecule air purifiers in the house. The house was built in the 1940s and he has lived there for 30 years. They had renovations done in the 2016 which possibly led to the leaking dormer and vent issues.   He smoked for 14-15 years where he smoked over a pack per day and he quit 31 years ago.    Past Medical History:  Diagnosis Date  . Allergic rhinitis   . Allergy   . Depression   . Fatty tumor    right upper arm - no surgery to remove  . Frozen shoulder    right  . GERD (gastroesophageal reflux disease)   . Hyperlipidemia   . Knee pain    LEFT- healed with no issues as og 01-26-16  . Neuromuscular disorder (HCC)    tremor  . Prostate cancer ([place])   . Psoriasis   . Tremor, essential    resting  . Urinary hesitancy    "SHY BLADDER"     Family History  Problem Relation Age of Onset  . Heart failure Father   . Cancer Mother   . Colon cancer Neg Hx   . Colon polyps Neg Hx      Social History   Socioeconomic History  . Marital status: Married    Spouse name: Not on file  . Number of children: Not on file  . Years of education: Not on file  .  Highest education level: Not on file  Occupational History  . Not on file  Tobacco Use  . Smoking status: Former Smoker    Types: Cigarettes    Quit date: 03/26/1989    Years since quitting: 31.6  . Smokeless tobacco: Former Systems developer  . Tobacco comment: long ago with no regularity   Substance and Sexual Activity  . Alcohol use: Yes    Alcohol/week: 7.0 standard drinks    Types: 7 Standard drinks or equivalent per week    Comment: 3 drinks at night   . Drug use: No  . Sexual activity: Not on file  Other Topics Concern  . Not on file  Social History Narrative  . Not on file   Social Determinants of Health   Financial Resource Strain: Not on file  Food Insecurity: Not on file  Transportation Needs: Not on file  Physical Activity: Not on file  Stress: Not on  file  Social Connections: Not on file  Intimate Partner Violence: Not on file     Allergies  Allergen Reactions  . Sulfamethoxazole     unsure  . Sulfa Antibiotics Rash     Outpatient Medications Prior to Visit  Medication Sig Dispense Refill  . albuterol (VENTOLIN HFA) 108 (90 Base) MCG/ACT inhaler Inhale 2 puffs into the lungs every 6 (six) hours as needed for wheezing or shortness of breath. 8 g 6  . allopurinol (ZYLOPRIM) 300 MG tablet TAKE 1 TABLET(300 MG) BY MOUTH DAILY 90 tablet 2  . atorvastatin (LIPITOR) 10 MG tablet TAKE 1 TABLET(10 MG) BY MOUTH DAILY 90 tablet 1  . benzonatate (TESSALON) 100 MG capsule Take 1 capsule (100 mg total) by mouth 3 (three) times daily as needed for cough. 30 capsule 0  . buPROPion (WELLBUTRIN XL) 300 MG 24 hr tablet Take 1 tablet (300 mg total) by mouth daily. 90 tablet 3  . fluticasone (FLOVENT HFA) 110 MCG/ACT inhaler Inhale 2 puffs into the lungs 2 (two) times daily. 1 each 4  . ibuprofen (ADVIL,MOTRIN) 200 MG tablet Take 800 mg by mouth every 6 (six) hours as needed.    Marland Kitchen ketoconazole (NIZORAL) 2 % cream Apply 1 application topically daily. Apply to foot daily x 3 weeks. 15 g 0  . meloxicam (MOBIC) 15 MG tablet Take 1 tablet (15 mg total) by mouth daily. With a meal. 30 tablet 3  . pantoprazole (PROTONIX) 40 MG tablet Take 1 tablet (40 mg total) by mouth daily. 30 tablet 3  . propranolol (INDERAL) 20 MG tablet TAKE ONE TABLET DAILY FOR TREMORS 90 tablet 3  . sertraline (ZOLOFT) 100 MG tablet TAKE 1 TABLET(100 MG) BY MOUTH DAILY 90 tablet 3  . sildenafil (VIAGRA) 50 MG tablet Take 50 mg by mouth daily as needed for erectile dysfunction.     No facility-administered medications prior to visit.    Review of Systems  Constitutional: Negative for chills, fever, malaise/fatigue and weight loss.  HENT: Negative for congestion, sinus pain and sore throat.   Eyes: Negative.   Respiratory: Positive for cough, sputum production and shortness of  breath. Negative for hemoptysis and wheezing.   Cardiovascular: Negative for chest pain, palpitations, orthopnea, claudication and leg swelling.  Gastrointestinal: Negative for abdominal pain, diarrhea, heartburn, nausea and vomiting.  Genitourinary: Negative.   Musculoskeletal: Negative.   Skin: Negative for rash.  Neurological: Negative.   Endo/Heme/Allergies: Negative.   Psychiatric/Behavioral: Negative.    Objective:   Vitals:   11/27/20 1422  BP: (!) 144/86  Pulse: (!) 111  Temp: 98.3 F (36.8 C)  TempSrc: Oral  SpO2: 97%  Weight: 163 lb (73.9 kg)  Height: 5\' 7"  (1.702 m)     Physical Exam Constitutional:      General: He is not in acute distress. HENT:     Head: Normocephalic and atraumatic.  Eyes:     General: No scleral icterus.    Conjunctiva/sclera: Conjunctivae normal.     Pupils: Pupils are equal, round, and reactive to light.  Cardiovascular:     Rate and Rhythm: Normal rate and regular rhythm.     Pulses: Normal pulses.     Heart sounds: Normal heart sounds. No murmur heard.   Pulmonary:     Effort: Pulmonary effort is normal.     Breath sounds: No wheezing, rhonchi or rales.  Abdominal:     General: Bowel sounds are normal.     Palpations: Abdomen is soft.  Musculoskeletal:     Right lower leg: No edema.     Left lower leg: No edema.  Skin:    General: Skin is warm and dry.  Neurological:     General: No focal deficit present.     Mental Status: He is alert.  Psychiatric:        Mood and Affect: Mood normal.        Behavior: Behavior normal.        Thought Content: Thought content normal.        Judgment: Judgment normal.    CBC    Component Value Date/Time   WBC 9.3 10/18/2020 1653   RBC 5.19 10/18/2020 1653   HGB 16.2 10/18/2020 1653   HCT 49.3 10/18/2020 1653   PLT 193.0 10/18/2020 1653   MCV 94.9 10/18/2020 1653   MCH 31.6 09/19/2020 1442   MCHC 32.8 10/18/2020 1653   RDW 13.4 10/18/2020 1653   LYMPHSABS 1.9 10/18/2020 1653    MONOABS 0.8 10/18/2020 1653   EOSABS 0.3 10/18/2020 1653   BASOSABS 0.1 10/18/2020 1653    BMP Latest Ref Rng & Units 08/18/2020 08/17/2019 06/17/2019  Glucose 65 - 99 mg/dL 104(H) 81 100(H)  BUN 7 - 25 mg/dL 16 19 13   Creatinine 0.70 - 1.33 mg/dL 0.99 0.91 1.08  BUN/Creat Ratio 6 - 22 (calc) NOT APPLICABLE NOT APPLICABLE NOT APPLICABLE  Sodium 423 - 146 mmol/L 137 138 143  Potassium 3.5 - 5.3 mmol/L 3.9 4.2 4.2  Chloride 98 - 110 mmol/L 100 102 104  CO2 20 - 32 mmol/L 23 25 27   Calcium 8.6 - 10.3 mg/dL 8.9 9.1 10.0   Chest imaging: CT Chest 09/22/20 There is no pneumothorax. There is atelectasis at the lung bases vs reticulation. There is no focal infiltrate. No pleural effusion.  CXR 09/18/20 Ill-defined airspace opacity is noted in the anterior left base. Lungs elsewhere are clear. Heart size and pulmonary vascular normal. No adenopathy. No bone lesions.  PFT: No flowsheet data found.    Assessment & Plan:   Gastroesophageal reflux disease without esophagitis - Plan: Ambulatory referral to Gastroenterology  Cough  Discussion: Patrick Sanchez is a 60 year old male, former smoker who returns to pulmonary clinic for chronic productive cough.  He remains concerned that the mold issue in his home is the cause of his health issues.   He had a chest radiograpn on 09/18/20 which was concerning for an ill defined airspace opacity of the left base in which a CT chest was obtained 09/22/20 where there is bibasilar subpleural atelectasis.  No parenchymal or airway issues were noted. Hypersensitivity pneumonitis panel was negative.   At this time I believe his cough and chest congestion are a result of GERD which is exacerbated by his alcohol use. I instructed him to cut back or stop drinking alcohol as this is making his reflux worse. He has seen Dr. Watt Climes of GI in the past so I will refer him for further treatment of his GERD. He is to obtain a wedge pillow to elevate his head while  sleeping to prevent night time reflux.   He can stop flovent inhaler as he has not noted benefit but he wishes to remain on this medication.   Follow up in 2 months.  Freda Jackson, MD [facility] Office: [phone]   See Amion for Pager Details    Current Outpatient Medications:  .  albuterol (VENTOLIN HFA) 108 (90 Base) MCG/ACT inhaler, Inhale 2 puffs into the lungs every 6 (six) hours as needed for wheezing or shortness of breath., Disp: 8 g, Rfl: 6 .  allopurinol (ZYLOPRIM) 300 MG tablet, TAKE 1 TABLET(300 MG) BY MOUTH DAILY, Disp: 90 tablet, Rfl: 2 .  atorvastatin (LIPITOR) 10 MG tablet, TAKE 1 TABLET(10 MG) BY MOUTH DAILY, Disp: 90 tablet, Rfl: 1 .  benzonatate (TESSALON) 100 MG capsule, Take 1 capsule (100 mg total) by mouth 3 (three) times daily as needed for cough., Disp: 30 capsule, Rfl: 0 .  buPROPion (WELLBUTRIN XL) 300 MG 24 hr tablet, Take 1 tablet (300 mg total) by mouth daily., Disp: 90 tablet, Rfl: 3 .  fluticasone (FLOVENT HFA) 110 MCG/ACT inhaler, Inhale 2 puffs into the lungs 2 (two) times daily., Disp: 1 each, Rfl: 4 .  ibuprofen (ADVIL,MOTRIN) 200 MG tablet, Take 800 mg by mouth every 6 (six) hours as needed., Disp: , Rfl:  .  ketoconazole (NIZORAL) 2 % cream, Apply 1 application topically daily. Apply to foot daily x 3 weeks., Disp: 15 g, Rfl: 0 .  meloxicam (MOBIC) 15 MG tablet, Take 1 tablet (15 mg total) by mouth daily. With a meal., Disp: 30 tablet, Rfl: 3 .  pantoprazole (PROTONIX) 40 MG tablet, Take 1 tablet (40 mg total) by mouth daily., Disp: 30 tablet, Rfl: 3 .  propranolol (INDERAL) 20 MG tablet, TAKE ONE TABLET DAILY FOR TREMORS, Disp: 90 tablet, Rfl: 3 .  sertraline (ZOLOFT) 100 MG tablet, TAKE 1 TABLET(100 MG) BY MOUTH DAILY, Disp: 90 tablet, Rfl: 3 .  sildenafil (VIAGRA) 50 MG tablet, Take 50 mg by mouth daily as needed for erectile dysfunction., Disp: , Rfl:

## 2020-11-27 NOTE — Patient Instructions (Addendum)
Start pantoprazole daily for GERD  Obtain a wedge pillow to sleep with the head elevated at night to reduce nocturnal reflux symptoms.  Work on reducing your alcohol intake to 2 drinks or less per day.   Continue with flovent inhaler

## 2020-11-28 DIAGNOSIS — Z20822 Contact with and (suspected) exposure to covid-19: Secondary | ICD-10-CM | POA: Diagnosis not present

## 2020-11-28 NOTE — Telephone Encounter (Signed)
Patient sent email , there are also two attached documents for review  I have thought about your question regarding if we had any other options of a place to live until our air particulate problem is remediated. We wish there was an alternative. I had been accidentally logged in as my wife when I sent the last message. This is another example of the brain fog that I am experiencing. Attached are the 2 documents I emailed to you that you didn't receive. I had hoped to discuss these so please let me know your observations  Patrick Sanchez 2 attachments   Sending to Dr. Erin Fulling for further recommendations

## 2020-11-29 NOTE — Telephone Encounter (Signed)
Please advise on patient mychart message  Text my wife sent me. See questions at end.  "Got MyChart notices-msgs for you. Had a "duh" moment re: Dr. August Albino comment about different living situation/symptoms. I can attest HE IS ABSOLUTELY RIGHT!  Think about this: I've been at hotel since [place] I bitch about how tired I still am but - my "duh" moment - I haven't been waking up coughing my head off & going through 1/2 box of tissues. Better still, not only do I NOT have white junk pouring down my face all day, my eyes haven't been watering at all! IMMEDIATE CHANGE. Sadly, no change at all in fatigue factor or cognitive shit.   Confused re: chart comments - data directly from [place]. Thinking % things I put in were calc'd wrong so Dr. Erin Fulling might have a point there. Since I didn't get to explain charts to you, see attc notes re: chart probs/clarity. Txt if ?  Big concern: Eosinophil trends. Big spike bet 08/18/20 & 09/19/20, yet 10/18/20 dramatic drop to 0.3 - pre-spike levels (12/28/15 = 0.4). Dec 12 = 419 (Normal HIGH = 500). Ask Doc: How can eosinophils drop 418.6 in 4 weeks following a 4-week spike of 66? What lab? (Labs may cost [place] over $400, should be accurate)."

## 2020-12-07 ENCOUNTER — Telehealth: Payer: Self-pay | Admitting: Internal Medicine

## 2020-12-07 ENCOUNTER — Telehealth: Payer: Self-pay | Admitting: Pulmonary Disease

## 2020-12-07 NOTE — Telephone Encounter (Signed)
Pt asking to speak with [employer] representative. Pt calling to complain about the "lack of attention to HIPPA."Pt very upset when he calls in he can hear other pt's "demographic information/health information." Pt states he has spoken to [facility] about this issue and also filed a complaint with [facility].Please advise [phone]

## 2020-12-07 NOTE — Telephone Encounter (Signed)
Patrick Sanchez [phone]  Bill called to inquire about MyChart message they had received. He stated that it said he needed to get his COVID vaccine. I looked in chart and he has had all 3, so I let him know I was not sure what he had received, that as far as I know he is up to date. We have not received any information from [facility] or the CDC that anyone may receive a 4th vaccine yet, the only thing I see he is due for is a flu shot. He verbalized understanding.

## 2020-12-07 NOTE — Telephone Encounter (Signed)
Patient emailed our office to say his smokeless tobacco history information was not correct. After a review of his chart, this information was not entered by our office and was done back in 2017. Emailed patient back to let him know that I will remove this from his chart.

## 2020-12-07 NOTE — Telephone Encounter (Signed)
Patrick Sanchez is aware of the complaint - Tammy will contact pt. -pr

## 2020-12-11 ENCOUNTER — Encounter: Payer: BC Managed Care – PPO | Admitting: Hematology and Oncology

## 2020-12-29 DIAGNOSIS — Z20822 Contact with and (suspected) exposure to covid-19: Secondary | ICD-10-CM | POA: Diagnosis not present

## 2021-01-05 DIAGNOSIS — K2100 Gastro-esophageal reflux disease with esophagitis, without bleeding: Secondary | ICD-10-CM | POA: Diagnosis not present

## 2021-01-05 DIAGNOSIS — K529 Noninfective gastroenteritis and colitis, unspecified: Secondary | ICD-10-CM | POA: Diagnosis not present

## 2021-01-30 ENCOUNTER — Ambulatory Visit: Payer: BC Managed Care – PPO | Admitting: Pulmonary Disease

## 2021-02-12 ENCOUNTER — Encounter: Payer: Self-pay | Admitting: Pulmonary Disease

## 2021-02-12 ENCOUNTER — Ambulatory Visit: Payer: BC Managed Care – PPO | Admitting: Pulmonary Disease

## 2021-02-12 ENCOUNTER — Other Ambulatory Visit: Payer: Self-pay

## 2021-02-12 VITALS — BP 120/72 | HR 83 | Temp 97.8°F | Ht 67.0 in | Wt 167.8 lb

## 2021-02-12 DIAGNOSIS — J4520 Mild intermittent asthma, uncomplicated: Secondary | ICD-10-CM | POA: Diagnosis not present

## 2021-02-12 DIAGNOSIS — K219 Gastro-esophageal reflux disease without esophagitis: Secondary | ICD-10-CM | POA: Diagnosis not present

## 2021-02-12 NOTE — Patient Instructions (Addendum)
Continue flovent inhaler 2 puffs twice daily - rinse mouth out after each use   Continue as needed albuterol inhaler  Continue pantoprazole 40mg  daily for GERD

## 2021-02-12 NOTE — Progress Notes (Signed)
Synopsis: Referred in 10/2020 for abnormal chest radiograph  Subjective:   PATIENT ID: Patrick Sanchez GENDER: male DOB: January 26, 1961, MRN: [id number]   HPI  Chief Complaint  Patient presents with  . Follow-up    3 mo f/u. States his breathing has been stable since last visit. States around mid March he had an exposure to covid but tested negative.    Patrick Sanchez is a 60 year old male, former smoker who returns to pulmonary clinic for chronic productive cough.  His cough is significantly improved from last visit after starting flovent 11mcg 2 puffs twice daily and also starting pantoprazole 40mg  daily after the visit on 11/27/20. He reports cutting back to 3-4 alcoholic drinks daily from 6-7. He feels the inhaler has helped the most with his cough. He continues to have a mild cough with intermittent mucous production. He is following with Dr. Watt Climes at [place] and has a EGD and C-scope planned for this August.    CT Chest 09/22/20 does not show interstitial involvement or opacities. No effusions or airway issues. Hypersensitivity panel was sent and is negative.   He smoked for 14-15 years where he smoked over a pack per day and he quit 31 years ago.    Past Medical History:  Diagnosis Date  . Allergic rhinitis   . Allergy   . Depression   . Fatty tumor    right upper arm - no surgery to remove  . Frozen shoulder    right  . GERD (gastroesophageal reflux disease)   . Hyperlipidemia   . Knee pain    LEFT- healed with no issues as og 01-26-16  . Neuromuscular disorder (HCC)    tremor  . Prostate cancer ([place])   . Psoriasis   . Tremor, essential    resting  . Urinary hesitancy    "SHY BLADDER"     Family History  Problem Relation Age of Onset  . Heart failure Father   . Cancer Mother   . Colon cancer Neg Hx   . Colon polyps Neg Hx      Social History   Socioeconomic History  . Marital status: Married    Spouse name: Not on file  . Number of children: Not on  file  . Years of education: Not on file  . Highest education level: Not on file  Occupational History  . Not on file  Tobacco Use  . Smoking status: Former Smoker    Types: Cigarettes    Quit date: 03/26/1989    Years since quitting: 31.9  . Smokeless tobacco: Never Used  . Tobacco comment: long ago with no regularity   Substance and Sexual Activity  . Alcohol use: Yes    Alcohol/week: 7.0 standard drinks    Types: 7 Standard drinks or equivalent per week    Comment: 3 drinks at night   . Drug use: No  . Sexual activity: Not on file  Other Topics Concern  . Not on file  Social History Narrative  . Not on file   Social Determinants of Health   Financial Resource Strain: Not on file  Food Insecurity: Not on file  Transportation Needs: Not on file  Physical Activity: Not on file  Stress: Not on file  Social Connections: Not on file  Intimate Partner Violence: Not on file     Allergies  Allergen Reactions  . Sulfamethoxazole     unsure  . Sulfa Antibiotics Rash     Outpatient Medications  Prior to Visit  Medication Sig Dispense Refill  . albuterol (VENTOLIN HFA) 108 (90 Base) MCG/ACT inhaler Inhale 2 puffs into the lungs every 6 (six) hours as needed for wheezing or shortness of breath. 8 g 6  . allopurinol (ZYLOPRIM) 300 MG tablet TAKE 1 TABLET(300 MG) BY MOUTH DAILY 90 tablet 2  . atorvastatin (LIPITOR) 10 MG tablet TAKE 1 TABLET(10 MG) BY MOUTH DAILY 90 tablet 1  . benzonatate (TESSALON) 100 MG capsule Take 1 capsule (100 mg total) by mouth 3 (three) times daily as needed for cough. 30 capsule 0  . buPROPion (WELLBUTRIN XL) 300 MG 24 hr tablet Take 1 tablet (300 mg total) by mouth daily. 90 tablet 3  . fluticasone (FLOVENT HFA) 110 MCG/ACT inhaler Inhale 2 puffs into the lungs 2 (two) times daily. 1 each 4  . ibuprofen (ADVIL,MOTRIN) 200 MG tablet Take 800 mg by mouth every 6 (six) hours as needed.    Marland Kitchen ketoconazole (NIZORAL) 2 % cream Apply 1 application topically  daily. Apply to foot daily x 3 weeks. 15 g 0  . meloxicam (MOBIC) 15 MG tablet Take 1 tablet (15 mg total) by mouth daily. With a meal. 30 tablet 3  . pantoprazole (PROTONIX) 40 MG tablet Take 1 tablet (40 mg total) by mouth daily. 30 tablet 3  . propranolol (INDERAL) 20 MG tablet TAKE ONE TABLET DAILY FOR TREMORS 90 tablet 3  . sertraline (ZOLOFT) 100 MG tablet TAKE 1 TABLET(100 MG) BY MOUTH DAILY 90 tablet 3  . sildenafil (VIAGRA) 50 MG tablet Take 50 mg by mouth daily as needed for erectile dysfunction.     No facility-administered medications prior to visit.    Review of Systems  Constitutional: Negative for chills, fever, malaise/fatigue and weight loss.  HENT: Negative for congestion, sinus pain and sore throat.   Eyes: Negative.   Respiratory: Positive for cough and sputum production. Negative for hemoptysis, shortness of breath and wheezing.   Cardiovascular: Negative for chest pain, palpitations, orthopnea, claudication and leg swelling.  Gastrointestinal: Negative for abdominal pain, diarrhea, heartburn, nausea and vomiting.  Genitourinary: Negative.   Musculoskeletal: Negative.   Skin: Negative for rash.  Neurological: Negative.   Endo/Heme/Allergies: Negative.   Psychiatric/Behavioral: Negative.    Objective:   Vitals:   02/12/21 1510  BP: 120/72  Pulse: 83  Temp: 97.8 F (36.6 C)  TempSrc: Temporal  SpO2: 97%  Weight: 167 lb 12.8 oz (76.1 kg)  Height: 5\' 7"  (1.702 m)     Physical Exam Constitutional:      General: He is not in acute distress. HENT:     Head: Normocephalic and atraumatic.  Eyes:     General: No scleral icterus.    Conjunctiva/sclera: Conjunctivae normal.     Pupils: Pupils are equal, round, and reactive to light.  Cardiovascular:     Rate and Rhythm: Normal rate and regular rhythm.     Pulses: Normal pulses.     Heart sounds: Normal heart sounds. No murmur heard.   Pulmonary:     Effort: Pulmonary effort is normal.     Breath  sounds: No wheezing, rhonchi or rales.  Abdominal:     General: Bowel sounds are normal.     Palpations: Abdomen is soft.  Musculoskeletal:     Right lower leg: No edema.     Left lower leg: No edema.  Skin:    General: Skin is warm and dry.  Neurological:     General: No focal deficit present.  Mental Status: He is alert.  Psychiatric:        Mood and Affect: Mood normal.        Behavior: Behavior normal.        Thought Content: Thought content normal.        Judgment: Judgment normal.    CBC    Component Value Date/Time   WBC 9.3 10/18/2020 1653   RBC 5.19 10/18/2020 1653   HGB 16.2 10/18/2020 1653   HCT 49.3 10/18/2020 1653   PLT 193.0 10/18/2020 1653   MCV 94.9 10/18/2020 1653   MCH 31.6 09/19/2020 1442   MCHC 32.8 10/18/2020 1653   RDW 13.4 10/18/2020 1653   LYMPHSABS 1.9 10/18/2020 1653   MONOABS 0.8 10/18/2020 1653   EOSABS 0.3 10/18/2020 1653   BASOSABS 0.1 10/18/2020 1653    BMP Latest Ref Rng & Units 08/18/2020 08/17/2019 06/17/2019  Glucose 65 - 99 mg/dL 104(H) 81 100(H)  BUN 7 - 25 mg/dL 16 19 13   Creatinine 0.70 - 1.33 mg/dL 0.99 0.91 1.08  BUN/Creat Ratio 6 - 22 (calc) NOT APPLICABLE NOT APPLICABLE NOT APPLICABLE  Sodium A999333 - 146 mmol/L 137 138 143  Potassium 3.5 - 5.3 mmol/L 3.9 4.2 4.2  Chloride 98 - 110 mmol/L 100 102 104  CO2 20 - 32 mmol/L 23 25 27   Calcium 8.6 - 10.3 mg/dL 8.9 9.1 10.0   Chest imaging: CT Chest 09/22/20 There is no pneumothorax. There is atelectasis at the lung bases vs reticulation. There is no focal infiltrate. No pleural effusion.  CXR 09/18/20 Ill-defined airspace opacity is noted in the anterior left base. Lungs elsewhere are clear. Heart size and pulmonary vascular normal. No adenopathy. No bone lesions.  PFT: No flowsheet data found.    Assessment & Plan:   Mild intermittent reactive airway disease without complication  Gastroesophageal reflux disease without esophagitis  Discussion: Zishan Aguiar is a  60 year old male, former smoker who returns to pulmonary clinic for chronic productive cough.  He appears to have some degree of reactive airways disease that has improved with treatment of his GERD with PPI therapy along with inhaled corticosteroid therapy. He is to continue pantoprazole 40mg  daily and flovent 119mcg 2 puffs twice daily.  We can consider pulmonary function tests in the future if his symptoms worsen.   He should continue to cut back further on his alcohol consumption as this has contributed to his GERD symptoms.   Follow up in 6 months.  Freda Jackson, MD [facility] Office: [phone]   See Amion for Pager Details    Current Outpatient Medications:  .  albuterol (VENTOLIN HFA) 108 (90 Base) MCG/ACT inhaler, Inhale 2 puffs into the lungs every 6 (six) hours as needed for wheezing or shortness of breath., Disp: 8 g, Rfl: 6 .  allopurinol (ZYLOPRIM) 300 MG tablet, TAKE 1 TABLET(300 MG) BY MOUTH DAILY, Disp: 90 tablet, Rfl: 2 .  atorvastatin (LIPITOR) 10 MG tablet, TAKE 1 TABLET(10 MG) BY MOUTH DAILY, Disp: 90 tablet, Rfl: 1 .  benzonatate (TESSALON) 100 MG capsule, Take 1 capsule (100 mg total) by mouth 3 (three) times daily as needed for cough., Disp: 30 capsule, Rfl: 0 .  buPROPion (WELLBUTRIN XL) 300 MG 24 hr tablet, Take 1 tablet (300 mg total) by mouth daily., Disp: 90 tablet, Rfl: 3 .  fluticasone (FLOVENT HFA) 110 MCG/ACT inhaler, Inhale 2 puffs into the lungs 2 (two) times daily., Disp: 1 each, Rfl: 4 .  ibuprofen (ADVIL,MOTRIN) 200  MG tablet, Take 800 mg by mouth every 6 (six) hours as needed., Disp: , Rfl:  .  ketoconazole (NIZORAL) 2 % cream, Apply 1 application topically daily. Apply to foot daily x 3 weeks., Disp: 15 g, Rfl: 0 .  meloxicam (MOBIC) 15 MG tablet, Take 1 tablet (15 mg total) by mouth daily. With a meal., Disp: 30 tablet, Rfl: 3 .  pantoprazole (PROTONIX) 40 MG tablet, Take 1 tablet (40 mg total) by mouth daily., Disp: 30  tablet, Rfl: 3 .  propranolol (INDERAL) 20 MG tablet, TAKE ONE TABLET DAILY FOR TREMORS, Disp: 90 tablet, Rfl: 3 .  sertraline (ZOLOFT) 100 MG tablet, TAKE 1 TABLET(100 MG) BY MOUTH DAILY, Disp: 90 tablet, Rfl: 3 .  sildenafil (VIAGRA) 50 MG tablet, Take 50 mg by mouth daily as needed for erectile dysfunction., Disp: , Rfl:

## 2021-02-14 DIAGNOSIS — K219 Gastro-esophageal reflux disease without esophagitis: Secondary | ICD-10-CM | POA: Diagnosis not present

## 2021-02-14 DIAGNOSIS — Z8546 Personal history of malignant neoplasm of prostate: Secondary | ICD-10-CM | POA: Diagnosis not present

## 2021-02-14 DIAGNOSIS — E7800 Pure hypercholesterolemia, unspecified: Secondary | ICD-10-CM | POA: Diagnosis not present

## 2021-02-22 ENCOUNTER — Encounter: Payer: Self-pay | Admitting: Pulmonary Disease

## 2021-04-02 ENCOUNTER — Other Ambulatory Visit: Payer: Self-pay | Admitting: Internal Medicine

## 2021-04-16 DIAGNOSIS — F4322 Adjustment disorder with anxiety: Secondary | ICD-10-CM | POA: Diagnosis not present

## 2021-05-29 DIAGNOSIS — Z87898 Personal history of other specified conditions: Secondary | ICD-10-CM | POA: Diagnosis not present

## 2021-05-29 DIAGNOSIS — R197 Diarrhea, unspecified: Secondary | ICD-10-CM | POA: Diagnosis not present

## 2021-05-30 DIAGNOSIS — R197 Diarrhea, unspecified: Secondary | ICD-10-CM | POA: Diagnosis not present

## 2021-06-05 IMAGING — CR DG CHEST 2V
2 series · 2 of 2 positions shown · non-contrast
Comparison: None.

CLINICAL DATA: Cough and shortness of breath

EXAM:
CHEST - 2 VIEW

[w chest pa]
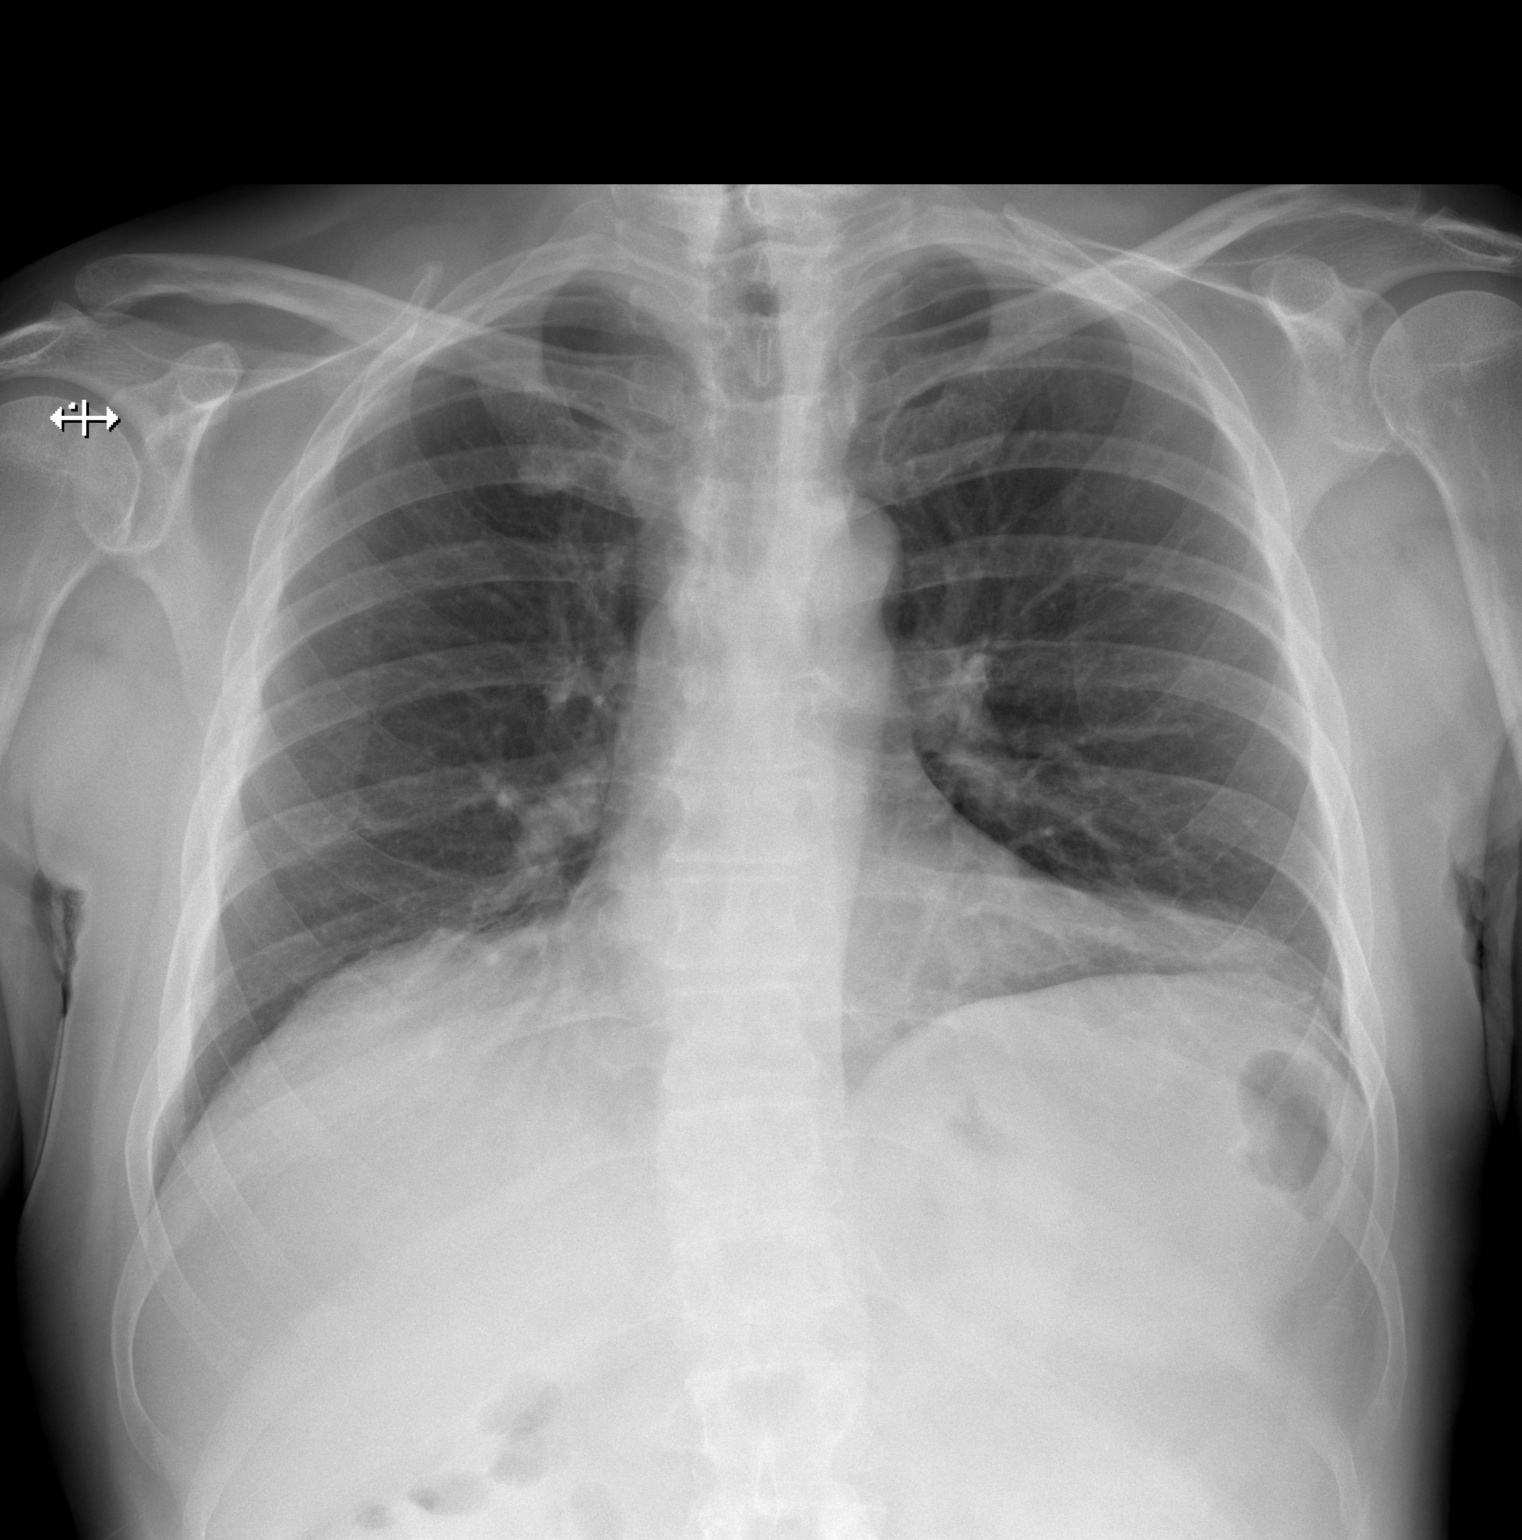

[w chest lat]
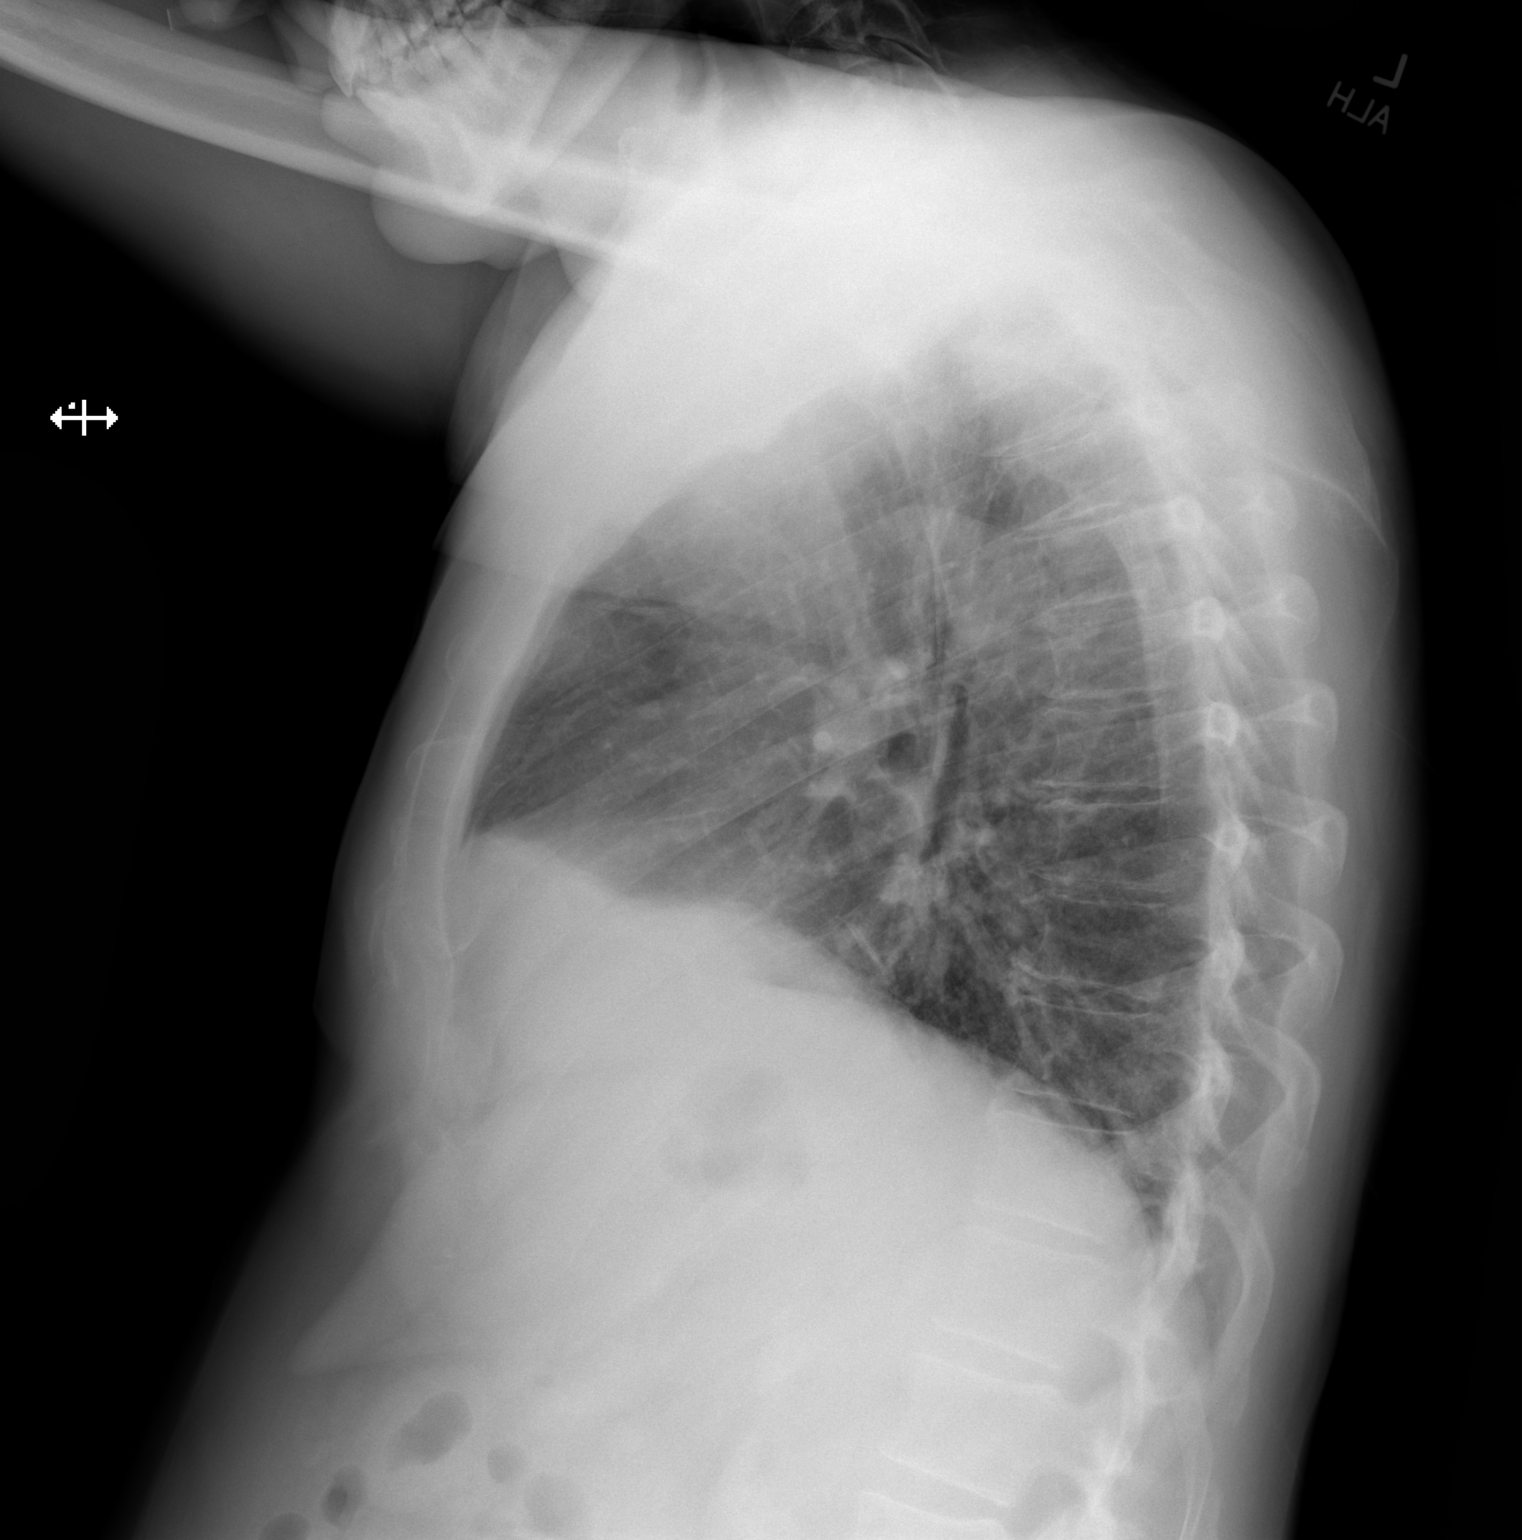

[2 of 2 positions shown; findings below may reference images not displayed]

FINDINGS: Ill-defined airspace opacity is noted in the anterior left base.
Lungs elsewhere are clear. Heart size and pulmonary vascular normal.
No adenopathy. No bone lesions.
IMPRESSION: Focal airspace opacity anterior left base concerning for focal
pneumonia. Lungs elsewhere clear. Heart size normal. No adenopathy
evident.

These results will be called to the ordering clinician or
representative by the Radiologist Assistant, and communication
documented in the PACS or [REDACTED].

## 2021-06-18 ENCOUNTER — Encounter: Payer: Self-pay | Admitting: Gastroenterology

## 2021-07-01 ENCOUNTER — Other Ambulatory Visit: Payer: Self-pay | Admitting: Internal Medicine

## 2021-08-02 DIAGNOSIS — G250 Essential tremor: Secondary | ICD-10-CM | POA: Diagnosis not present

## 2021-08-02 DIAGNOSIS — R2689 Other abnormalities of gait and mobility: Secondary | ICD-10-CM | POA: Diagnosis not present

## 2021-08-16 DIAGNOSIS — Z0000 Encounter for general adult medical examination without abnormal findings: Secondary | ICD-10-CM | POA: Diagnosis not present

## 2021-08-16 DIAGNOSIS — Z8546 Personal history of malignant neoplasm of prostate: Secondary | ICD-10-CM | POA: Diagnosis not present

## 2021-08-16 DIAGNOSIS — E7800 Pure hypercholesterolemia, unspecified: Secondary | ICD-10-CM | POA: Diagnosis not present

## 2021-08-16 DIAGNOSIS — Z125 Encounter for screening for malignant neoplasm of prostate: Secondary | ICD-10-CM | POA: Diagnosis not present

## 2021-08-23 DIAGNOSIS — Z8546 Personal history of malignant neoplasm of prostate: Secondary | ICD-10-CM | POA: Diagnosis not present

## 2021-08-23 DIAGNOSIS — Z0000 Encounter for general adult medical examination without abnormal findings: Secondary | ICD-10-CM | POA: Diagnosis not present

## 2021-08-23 DIAGNOSIS — Z8249 Family history of ischemic heart disease and other diseases of the circulatory system: Secondary | ICD-10-CM | POA: Diagnosis not present

## 2021-08-23 DIAGNOSIS — E7800 Pure hypercholesterolemia, unspecified: Secondary | ICD-10-CM | POA: Diagnosis not present

## 2021-10-11 ENCOUNTER — Other Ambulatory Visit: Payer: Self-pay | Admitting: *Deleted

## 2021-10-11 ENCOUNTER — Encounter: Payer: Self-pay | Admitting: *Deleted

## 2021-10-16 ENCOUNTER — Ambulatory Visit: Payer: BC Managed Care – PPO | Admitting: Diagnostic Neuroimaging

## 2021-10-23 DIAGNOSIS — E7800 Pure hypercholesterolemia, unspecified: Secondary | ICD-10-CM | POA: Diagnosis not present

## 2021-10-23 DIAGNOSIS — Z0000 Encounter for general adult medical examination without abnormal findings: Secondary | ICD-10-CM | POA: Diagnosis not present

## 2022-02-04 DIAGNOSIS — E7800 Pure hypercholesterolemia, unspecified: Secondary | ICD-10-CM | POA: Diagnosis not present

## 2022-05-22 ENCOUNTER — Ambulatory Visit (INDEPENDENT_AMBULATORY_CARE_PROVIDER_SITE_OTHER): Payer: BC Managed Care – PPO

## 2022-05-22 ENCOUNTER — Ambulatory Visit: Payer: BC Managed Care – PPO | Admitting: Podiatry

## 2022-05-22 ENCOUNTER — Encounter: Payer: Self-pay | Admitting: Podiatry

## 2022-05-22 DIAGNOSIS — M722 Plantar fascial fibromatosis: Secondary | ICD-10-CM

## 2022-05-22 MED ORDER — TRIAMCINOLONE ACETONIDE 10 MG/ML IJ SUSP
10.0000 mg | Freq: Once | INTRAMUSCULAR | Status: AC
  Administered 2022-05-22: 10 mg

## 2022-05-22 NOTE — Progress Notes (Signed)
Subjective:   Patient ID: Patrick Sanchez, male   DOB: 61 y.o.   MRN: [id number]   HPI Patient presents with a lot of discomfort in the plantar aspect left heel which has been going on for a while and is gotten worse over the last month.  States it is very sore when he steps down on it worse when he gets up in the morning after periods of sitting and patient does not smoke likes to be active if possible   Review of Systems  All other systems reviewed and are negative.       Objective:  Physical Exam Vitals and nursing note reviewed.  Constitutional:      Appearance: He is well-developed.  Pulmonary:     Effort: Pulmonary effort is normal.  Musculoskeletal:        General: Normal range of motion.  Skin:    General: Skin is warm.  Neurological:     Mental Status: He is alert.     Neurovascular status intact muscle strength is found to be adequate range of motion adequate with exquisite discomfort plantar aspect left heel at the insertional point of the tendon into the calcaneus with inflammation fluid in the medial band with moderate depression of the arch noted     Assessment:  Acute Planter fasciitis left with inflammation fluid of the medial band     Plan:  H&P x-ray reviewed sterile prep and injected the fascia at its insertion 3 mg Kenalog 5 mg Xylocaine applied fascial brace to lift up the arch gave instructions for stretching physical therapy shoe gear modifications reappoint to recheck  X-rays indicate small spur no indication stress fracture arthritis also placed on oral diclofenac

## 2022-05-22 NOTE — Patient Instructions (Signed)

## 2022-05-29 DIAGNOSIS — K635 Polyp of colon: Secondary | ICD-10-CM | POA: Diagnosis not present

## 2022-05-29 DIAGNOSIS — Z86010 Personal history of colonic polyps: Secondary | ICD-10-CM | POA: Diagnosis not present

## 2022-05-29 DIAGNOSIS — K317 Polyp of stomach and duodenum: Secondary | ICD-10-CM | POA: Diagnosis not present

## 2022-05-29 DIAGNOSIS — K2100 Gastro-esophageal reflux disease with esophagitis, without bleeding: Secondary | ICD-10-CM | POA: Diagnosis not present

## 2022-05-29 DIAGNOSIS — K5730 Diverticulosis of large intestine without perforation or abscess without bleeding: Secondary | ICD-10-CM | POA: Diagnosis not present

## 2022-05-29 DIAGNOSIS — K649 Unspecified hemorrhoids: Secondary | ICD-10-CM | POA: Diagnosis not present

## 2022-05-29 DIAGNOSIS — K449 Diaphragmatic hernia without obstruction or gangrene: Secondary | ICD-10-CM | POA: Diagnosis not present

## 2022-06-05 ENCOUNTER — Encounter: Payer: Self-pay | Admitting: Podiatry

## 2022-06-05 ENCOUNTER — Ambulatory Visit: Payer: BC Managed Care – PPO | Admitting: Podiatry

## 2022-06-05 DIAGNOSIS — M722 Plantar fascial fibromatosis: Secondary | ICD-10-CM

## 2022-06-05 MED ORDER — TRIAMCINOLONE ACETONIDE 10 MG/ML IJ SUSP
10.0000 mg | Freq: Once | INTRAMUSCULAR | Status: AC
  Administered 2022-06-05: 10 mg

## 2022-06-05 NOTE — Progress Notes (Signed)
Subjective:   Patient ID: Patrick Sanchez, male   DOB: 61 y.o.   MRN: [id number]   HPI Patient has severe pain in his left plantar heel states that its been very sore when he tries to walk on it and it did not improve with medication beyond a couple days   ROS      Objective:  Physical Exam  Neuro vascular status intact with acute inflammation plantar aspect left heel at the insertional point of the tendon into the calcaneus with fluid buildup around the medial and central band     Assessment:  Acute plantar fasciitis left with inflammation fluid buildup     Plan:  H&P reviewed condition went ahead today did sterile prep and injected the plantar fascia at insertion 3 mg Kenalog 5 mg Xylocaine and went ahead because of the intense discomfort dispensed air fracture walker to completely immobilize the plantar foot with the air fracture walker fitted to his left lower leg found to be in proper position.  Reappoint 4 weeks or earlier if needed

## 2022-06-07 ENCOUNTER — Encounter: Payer: Self-pay | Admitting: Nurse Practitioner

## 2022-06-07 ENCOUNTER — Ambulatory Visit: Payer: BC Managed Care – PPO | Admitting: Nurse Practitioner

## 2022-06-07 ENCOUNTER — Ambulatory Visit (INDEPENDENT_AMBULATORY_CARE_PROVIDER_SITE_OTHER)
Admission: RE | Admit: 2022-06-07 | Discharge: 2022-06-07 | Disposition: A | Discharge status: Home / Self Care | Payer: BC Managed Care – PPO | Source: Ambulatory Visit | Attending: Nurse Practitioner | Admitting: Nurse Practitioner

## 2022-06-07 VITALS — BP 116/68 | HR 79 | Temp 97.9°F | Ht 67.0 in | Wt 156.2 lb

## 2022-06-07 DIAGNOSIS — N529 Male erectile dysfunction, unspecified: Secondary | ICD-10-CM

## 2022-06-07 DIAGNOSIS — J45909 Unspecified asthma, uncomplicated: Secondary | ICD-10-CM | POA: Insufficient documentation

## 2022-06-07 DIAGNOSIS — J4530 Mild persistent asthma, uncomplicated: Secondary | ICD-10-CM

## 2022-06-07 DIAGNOSIS — J4520 Mild intermittent asthma, uncomplicated: Secondary | ICD-10-CM

## 2022-06-07 DIAGNOSIS — J3089 Other allergic rhinitis: Secondary | ICD-10-CM

## 2022-06-07 DIAGNOSIS — R053 Chronic cough: Secondary | ICD-10-CM

## 2022-06-07 DIAGNOSIS — J410 Simple chronic bronchitis: Secondary | ICD-10-CM | POA: Insufficient documentation

## 2022-06-07 DIAGNOSIS — R059 Cough, unspecified: Secondary | ICD-10-CM | POA: Diagnosis not present

## 2022-06-07 LAB — SPIROMETRY WITH GRAPH

## 2022-06-07 MED ORDER — SILDENAFIL CITRATE 50 MG PO TABS: 50.0000 mg | ORAL_TABLET | Freq: Every day | ORAL | 0 refills | Status: DC | PRN

## 2022-06-07 NOTE — Assessment & Plan Note (Addendum)
Previous improvement in cough with ICS. Despite FeNO being nl today, we will restart this to see if he receives benefit. See above plan.

## 2022-06-07 NOTE — Assessment & Plan Note (Addendum)
Chronic cough with AM chest congestion. Possible reactive airway disease/asthma component given his allergic symptoms and history of elevated eosinophils. His FeNO was normal today and no evidence of bronchospasm; however, he had good response to flovent in the past so recommend we re-trial this. He has significant sinus symptoms; suspect upper airway irritation contributing to his cough. Advised he should restart flonase and use OTC antihistamine daily. Check allergen panel today.   He does have mold exposure. If no improvement in symptoms with above recommendations, we will recheck CT chest and set him up for PFTs. Unable to complete spirometry today.  Patient Instructions  Continue Albuterol inhaler 2 puffs every 6 hours as needed for shortness of breath or wheezing. Notify if symptoms persist despite rescue inhaler/neb use.  Continue guaifenesin over the counter daily as you have been for chest congestion  Start over the counter antihistamine such as Zyrtec, Claritin, Xyzal, allegra  Flonase nasal spray 2 sprays each nostril daily   Retrial flovent 2 puffs Twice daily. Brush tongue and rinse mouth afterwards  Chest x ray  Follow up in 4 weeks to see how inhaler is going with Dr. Erin Fulling or Alanson Aly. If symptoms do not improve or worsen, please contact office for sooner follow up or seek emergency care.

## 2022-06-07 NOTE — Progress Notes (Signed)
$'@Patient'l$  ID: Patrick Sanchez, male    DOB: 1960-12-06, 61 y.o.   MRN: [id number]  Chief Complaint  Patient presents with   Follow-up    Pt states his breathing is getting worse. Pt is still breathing dust and molds in his home giving him tons of congestion.     Referring provider: Holland Commons, FNP  HPI: 61 year old male, former smoker followed for reactive airway disease and chronic cough. He is a patient of Dr. August Albino and last seen in office on 02/12/2021. Past medical history significant for GERD, essential tremor, prostate cancer, HLD, depression, ETOH abuse, erectile dysfunction.  TEST/EVENTS:  09/22/2020 CT chest wo contrast: bibasilar atelectasis otherwise lungs are clear. 10/18/2020: hypersensitivity pneumonitis panel negative; eosinophils 300; IgE 22  02/12/2021: OV with Dr. Erin Fulling. Initially seen for chronic cough and chest congestion. He was started on flovent and pantoprazole at previous visit. Cough significantly improved. Felt as though the inhaler helped the most. Still having a mild cough with intermittent mucous production. Can consider PFTs in the future if symptoms worsen. Advised to cut back on his alcohol intake.   06/07/2022: Today - acute Patient presents today for worsening cough and chest congestion. He has been dealing with mold in his house for some time now. He does feel like they've gotten a lot of it taken care of but there's still more that needs to be done. This has been an ongoing problem for years now. He wakes up in the morning with chest congestion and sinus congestion. He coughs frequently and produces some clear phlegm, then he tends to feel better the rest of the day. His sinus symptoms are persistent throughout the day; feels like they've gotten worse over the past few years. He doesn't really have any shortness of breath. Walks 4 miles a day for exercise without difficulties. He denies any wheezing, fevers, night sweats, hemoptysis, orthopnea,  leg swelling. No significant GERD symptoms that he notices. He takes a mucus relief medication and occasionally dayquil, which help his symptoms. He stopped the flovent inhaler and protonix since he was seen last. Previously felt like the flovent helped his cough and congestion. He forgets about his albuterol so hasn't used it.   FeNO 13 ppb  Allergies  Allergen Reactions   Sulfamethoxazole     unsure   Sulfa Antibiotics Rash    Immunization History  Administered Date(s) Administered   Influenza Inj Mdck Quad Pf 09/17/2019   Influenza,inj,Quad PF,6+ Mos 09/04/2015, 06/25/2018, 09/15/2019   PFIZER(Purple Top)SARS-COV-2 Vaccination 12/23/2019, 01/17/2020, 06/13/2020   Tdap 01/05/2002, 03/14/2017    Past Medical History:  Diagnosis Date   Allergic rhinitis    Allergy    Asthma    Balance problem    Depression    Fatty tumor    right upper arm - no surgery to remove   Frozen shoulder    right   GERD (gastroesophageal reflux disease)    Hyperlipidemia    Knee pain    LEFT- healed with no issues as og 01-26-16   Neuromuscular disorder (HCC)    tremor   Prostate cancer ([place])    Psoriasis    Tremor, essential    resting   Urinary hesitancy    "SHY BLADDER"    Tobacco History: Social History   Tobacco Use  Smoking Status Former   Types: Cigarettes   Quit date: 03/26/1989   Years since quitting: 33.2  Smokeless Tobacco Never  Tobacco Comments   long ago with  no regularity    Counseling given: Not Answered Tobacco comments: long ago with no regularity    Outpatient Medications Prior to Visit  Medication Sig Dispense Refill   albuterol (VENTOLIN HFA) 108 (90 Base) MCG/ACT inhaler Inhale 2 puffs into the lungs every 6 (six) hours as needed for wheezing or shortness of breath. 8 g 6   allopurinol (ZYLOPRIM) 300 MG tablet TAKE 1 TABLET(300 MG) BY MOUTH DAILY 90 tablet 2   atorvastatin (LIPITOR) 10 MG tablet TAKE 1 TABLET(10 MG) BY MOUTH DAILY 90 tablet 1   buPROPion  (WELLBUTRIN XL) 300 MG 24 hr tablet Take 1 tablet (300 mg total) by mouth daily. 90 tablet 3   colestipol (COLESTID) 1 g tablet 2 tablets daily.     fluticasone (FLOVENT HFA) 110 MCG/ACT inhaler Inhale 2 puffs into the lungs 2 (two) times daily. 1 each 4   ibuprofen (ADVIL,MOTRIN) 200 MG tablet Take 800 mg by mouth every 6 (six) hours as needed.     meloxicam (MOBIC) 15 MG tablet Take 1 tablet (15 mg total) by mouth daily. With a meal. 30 tablet 3   pantoprazole (PROTONIX) 40 MG tablet Take 1 tablet (40 mg total) by mouth daily. 30 tablet 3   propranolol (INDERAL) 20 MG tablet TAKE ONE TABLET DAILY FOR TREMORS 90 tablet 3   sertraline (ZOLOFT) 100 MG tablet TAKE 1 TABLET(100 MG) BY MOUTH DAILY 90 tablet 3   sildenafil (VIAGRA) 50 MG tablet Take 50 mg by mouth daily as needed for erectile dysfunction.     benzonatate (TESSALON) 100 MG capsule Take 1 capsule (100 mg total) by mouth 3 (three) times daily as needed for cough. (Patient not taking: Reported on 06/07/2022) 30 capsule 0   No facility-administered medications prior to visit.     Review of Systems:   Constitutional: No weight loss or gain, night sweats, fevers, chills, fatigue, or lassitude. HEENT: No headaches, difficulty swallowing, tooth/dental problems, or sore throat. No sneezing, itching, ear ache. +nasal congestion,  post nasal drip CV:  No chest pain, orthopnea, PND, swelling in lower extremities, anasarca, dizziness, palpitations, syncope Resp: +chest congestion; productive cough. No shortness of breath with exertion or at rest. No excess mucus or change in color of mucus. No hemoptysis. No wheezing.  No chest wall deformity GI:  No heartburn, indigestion, abdominal pain, nausea, vomiting, diarrhea, change in bowel habits, loss of appetite, bloody stools.  MSK:  No joint pain or swelling.  No decreased range of motion.  No back pain. Neuro: No dizziness or lightheadedness.  Psych: No depression or anxiety. Mood stable.      Physical Exam:  BP 116/68 (BP Location: Right Arm, Patient Position: Sitting, Cuff Size: Normal)   Pulse 79   Temp 97.9 F (36.6 C) (Oral)   Ht '5\' 7"'$  (1.702 m)   Wt 156 lb 3.2 oz (70.9 kg)   SpO2 100%   BMI 24.46 kg/m   GEN: Pleasant, interactive, well-appearing; in no acute distress. HEENT:  Normocephalic and atraumatic. PERRLA. Sclera white. Nasal turbinates erythematous, moist and patent bilaterally. Clear rhinorrhea present. Oropharynx erythematous and moist, without exudate or edema. No lesions, ulcerations NECK:  Supple w/ fair ROM. No JVD present. Normal carotid impulses w/o bruits. Thyroid symmetrical with no goiter or nodules palpated. No lymphadenopathy.   CV: RRR, no m/r/g, no peripheral edema. Pulses intact, +2 bilaterally. No cyanosis, pallor or clubbing. PULMONARY:  Unlabored, regular breathing. Clear bilaterally A&P w/o wheezes/rales/rhonchi. No accessory muscle use. No dullness to percussion.  GI: BS present and normoactive. Soft, non-tender to palpation. No organomegaly or masses detected. No CVA tenderness. MSK: No erythema, warmth or tenderness. Cap refil <2 sec all extrem. No deformities or joint swelling noted.  Neuro: A/Ox3. No focal deficits noted.   Skin: Warm, no lesions or rashe Psych: Normal affect and behavior. Judgement and thought content appropriate.     Lab Results:  CBC    Component Value Date/Time   WBC 9.3 10/18/2020 1653   RBC 5.19 10/18/2020 1653   HGB 16.2 10/18/2020 1653   HCT 49.3 10/18/2020 1653   PLT 193.0 10/18/2020 1653   MCV 94.9 10/18/2020 1653   MCH 31.6 09/19/2020 1442   MCHC 32.8 10/18/2020 1653   RDW 13.4 10/18/2020 1653   LYMPHSABS 1.9 10/18/2020 1653   MONOABS 0.8 10/18/2020 1653   EOSABS 0.3 10/18/2020 1653   BASOSABS 0.1 10/18/2020 1653    BMET    Component Value Date/Time   NA 137 08/18/2020 0939   K 3.9 08/18/2020 0939   CL 100 08/18/2020 0939   CO2 23 08/18/2020 0939   GLUCOSE 104 (H) 08/18/2020 0939    BUN 16 08/18/2020 0939   CREATININE 0.99 08/18/2020 0939   CALCIUM 8.9 08/18/2020 0939   GFRNONAA 83 08/18/2020 0939   GFRAA 96 08/18/2020 0939    BNP No results found for: "BNP"   Imaging:  DG Foot 2 Views Left  Result Date: 06/05/2022 Please see detailed radiograph report in office note.   triamcinolone acetonide (KENALOG) 10 MG/ML injection 10 mg     Date Action Dose Route User   05/22/2022 1409 Given 10 mg Other Wallene Huh, DPM      triamcinolone acetonide (KENALOG) 10 MG/ML injection 10 mg     Date Action Dose Route User   06/05/2022 1500 Given 10 mg Other Regal, Norman S, DPM           No data to display          No results found for: "NITRICOXIDE"      Assessment & Plan:   Chronic cough Chronic cough with AM chest congestion. Possible reactive airway disease/asthma component given his allergic symptoms and history of elevated eosinophils. His FeNO was normal today and no evidence of bronchospasm; however, he had good response to flovent in the past so recommend we re-trial this. He has significant sinus symptoms; suspect upper airway irritation contributing to his cough. Advised he should restart flonase and use OTC antihistamine daily. Check allergen panel today.   He does have mold exposure. If no improvement in symptoms with above recommendations, we will recheck CT chest and set him up for PFTs. Unable to complete spirometry today.  Patient Instructions  Continue Albuterol inhaler 2 puffs every 6 hours as needed for shortness of breath or wheezing. Notify if symptoms persist despite rescue inhaler/neb use.  Continue guaifenesin over the counter daily as you have been for chest congestion  Start over the counter antihistamine such as Zyrtec, Claritin, Xyzal, allegra  Flonase nasal spray 2 sprays each nostril daily   Retrial flovent 2 puffs Twice daily. Brush tongue and rinse mouth afterwards  Chest x ray  Follow up in 4 weeks to see how  inhaler is going with Dr. Erin Fulling or Alanson Aly. If symptoms do not improve or worsen, please contact office for sooner follow up or seek emergency care.     Reactive airway disease Previous improvement in cough with ICS. Despite FeNO being nl today, we will  restart this to see if he receives benefit. See above plan.  Allergic rhinitis See above.    I spent 35 minutes of dedicated to the care of this patient on the date of this encounter to include pre-visit review of records, face-to-face time with the patient discussing conditions above, post visit ordering of testing, clinical documentation with the electronic health record, making appropriate referrals as documented, and communicating necessary findings to members of the patients care team.  Patrick Bibles, NP 06/07/2022  Pt aware and understands NP's role.

## 2022-06-07 NOTE — Assessment & Plan Note (Signed)
See above

## 2022-06-07 NOTE — Patient Instructions (Addendum)
Continue Albuterol inhaler 2 puffs every 6 hours as needed for shortness of breath or wheezing. Notify if symptoms persist despite rescue inhaler/neb use.  Continue guaifenesin over the counter daily as you have been for chest congestion  Start over the counter antihistamine such as Zyrtec, Claritin, Xyzal, allegra  Flonase nasal spray 2 sprays each nostril daily   Retrial flovent 2 puffs Twice daily. Brush tongue and rinse mouth afterwards  Chest x ray  Follow up in 4 weeks to see how inhaler is going with Dr. Erin Fulling or Alanson Aly. If symptoms do not improve or worsen, please contact office for sooner follow up or seek emergency care.

## 2022-06-11 LAB — ALLERGEN PANEL (27) + IGE
Alternaria Alternata IgE: <0.1 kU/L
Aspergillus Fumigatus IgE: <0.1 kU/L
Bahia Grass IgE: <0.1 kU/L
Bermuda Grass IgE: <0.1 kU/L
Cat Dander IgE: <0.1 kU/L
Cedar, Mountain IgE: <0.1 kU/L
Cladosporium Herbarum IgE: <0.1 kU/L
Cocklebur IgE: <0.1 kU/L
Cockroach, American IgE: <0.1 kU/L
Common Silver Birch IgE: <0.1 kU/L
D Farinae IgE: 0.25 kU/L — AB
D Pteronyssinus IgE: 0.66 kU/L — AB
Dog Dander IgE: <0.1 kU/L
Elm, American IgE: <0.1 kU/L
Hickory, White IgE: <0.1 kU/L
IgE (Immunoglobulin E), Serum: 41 IU/mL (ref 6–495)
Johnson Grass IgE: <0.1 kU/L
Kentucky Bluegrass IgE: <0.1 kU/L
Maple/Box Elder IgE: <0.1 kU/L
Mucor Racemosus IgE: <0.1 kU/L
Oak, White IgE: <0.1 kU/L
Penicillium Chrysogen IgE: <0.1 kU/L
Pigweed, Rough IgE: <0.1 kU/L
Plantain, English IgE: <0.1 kU/L
Ragweed, Short IgE: <0.1 kU/L
Setomelanomma Rostrat: <0.1 kU/L
Timothy Grass IgE: <0.1 kU/L
White Mulberry IgE: <0.1 kU/L

## 2022-06-11 NOTE — Progress Notes (Signed)
Please notify patient that his allergen panel was positive for dust/dust mites. Otherwise, negative. Thanks.

## 2022-06-12 NOTE — Progress Notes (Signed)
ATC x2.  LVM to return call.

## 2022-06-13 ENCOUNTER — Telehealth: Payer: Self-pay | Admitting: Nurse Practitioner

## 2022-06-13 NOTE — Telephone Encounter (Signed)
Patient calling back to go over results. Please call back.

## 2022-06-13 NOTE — Telephone Encounter (Signed)
Called and spoke with patient, advised of results/recommendations per Roxan Diesel NP.  He verbalized understanding.  Nothing further needed.  ATC regarding, left vm to return call.

## 2022-06-13 NOTE — Progress Notes (Signed)
Called and spoke with patient, advised of results per Roxan Diesel NP.  He verbalized understanding.  Nothing further needed.

## 2022-06-13 NOTE — Telephone Encounter (Signed)
Patient called back to get lab results from 9/1- please call back at [phone].

## 2022-07-03 ENCOUNTER — Ambulatory Visit: Payer: BC Managed Care – PPO | Admitting: Podiatry

## 2022-07-03 ENCOUNTER — Encounter: Payer: Self-pay | Admitting: Podiatry

## 2022-07-03 DIAGNOSIS — M722 Plantar fascial fibromatosis: Secondary | ICD-10-CM

## 2022-07-04 ENCOUNTER — Ambulatory Visit: Payer: BC Managed Care – PPO | Admitting: Pulmonary Disease

## 2022-07-04 ENCOUNTER — Encounter: Payer: Self-pay | Admitting: Pulmonary Disease

## 2022-07-04 VITALS — BP 134/80 | HR 68 | Ht 67.0 in | Wt 155.0 lb

## 2022-07-04 DIAGNOSIS — R0982 Postnasal drip: Secondary | ICD-10-CM | POA: Diagnosis not present

## 2022-07-04 DIAGNOSIS — R0981 Nasal congestion: Secondary | ICD-10-CM | POA: Diagnosis not present

## 2022-07-04 MED ORDER — IPRATROPIUM BROMIDE 0.03 % NA SOLN: 2.0000 | Freq: Two times a day (BID) | NASAL | 12 refills | Status: DC

## 2022-07-04 NOTE — Progress Notes (Signed)
Subjective:   Patient ID: Patrick Sanchez, male   DOB: 61 y.o.   MRN: [id number]   HPI Patient states he has not been doing the physical therapy he was supposed to has been wearing his boot seems to be showing improvement but still has some discomfort   ROS      Objective:  Physical Exam  Neuro vascular status intact with discomfort still in the plantar heel left improved but still present upon deep palpation     Assessment:  Planter fasciitis which is showing improvement still has moderate discomfort but is progressing but has not been doing physical therapy     Plan:  Reviewed condition and recommended that he wean himself off the boot but ramp up his physical therapy along with good shoe support.  If symptoms recur may require more aggressive approach

## 2022-07-04 NOTE — Progress Notes (Signed)
Synopsis: Referred in 10/2020 for abnormal chest radiograph  Subjective:   PATIENT ID: Patrick Sanchez GENDER: male DOB: 15-Feb-1961, MRN: [id number]  HPI  Chief Complaint  Patient presents with   Follow-up    4 wk f/u for congestion. States the congestion has not changed since last visit. Nasal sprays and Mucinex did not help. Does have a history of black mold at home.    Patrick Sanchez is a 61 year old male, former smoker who returns to pulmonary clinic for chronic productive cough.  Patient was seen by Roxan Diesel, NP 06/07/22 for cough and chest congestion. He started fluticasone nasal spray without much improvement in his sinus congestion, sinus drainage and cough. He denies wheezing and dyspnea. He was instructed to start OTC allergy medicine but did not start this. Chest x-ray 06/07/22 is without acute airspace opacity and with unchanged scarring/atelectasis of the left lung base.  He has lost weight due to increased physical activity since last year. He has cut down significant on alcohol use and is watching his diet. He has no issues with GERD at this time and is not using PPI therapy.  OV 02/12/21 His cough is significantly improved from last visit after starting flovent 135mg 2 puffs twice daily and also starting pantoprazole '40mg'$  daily after the visit on 11/27/20. He reports cutting back to 3-4 alcoholic drinks daily from 6-7. He feels the inhaler has helped the most with his cough. He continues to have a mild cough with intermittent mucous production. [place] for this August.    CT Chest 09/22/20 does not show interstitial involvement or opacities. No effusions or airway issues. Hypersensitivity panel was sent and is negative.   He smoked for 14-15 years where he smoked over a pack per day and he quit 31 years ago.    Past Medical History:  Diagnosis Date   Allergic rhinitis    Allergy    Asthma    Balance  problem    Depression    Fatty tumor    right upper arm - no surgery to remove   Frozen shoulder    right   GERD (gastroesophageal reflux disease)    Hyperlipidemia    Knee pain    LEFT- healed with no issues as og 01-26-16   Neuromuscular disorder (HCC)    tremor   Prostate cancer (HCC)    Psoriasis    Tremor, essential    resting   Urinary hesitancy    "SHY BLADDER"     Family History  Problem Relation Age of Onset   Cancer Mother    Pancreatic cancer Mother    Diabetes Father    Heart failure Father    Diabetes Sister    Diabetes Brother    Colon cancer Neg Hx    Colon polyps Neg Hx      Social History   Socioeconomic History   Marital status: Married    Spouse name: Not on file   Number of children: 2   Years of education: Not on file   Highest education level: Not on file  Occupational History   Not on file  Tobacco Use   Smoking status: Former    Types: Cigarettes    Quit date: 03/26/1989    Years since quitting: 33.2   Smokeless tobacco: Never   Tobacco comments:    long ago with no regularity   Substance and  Sexual Activity   Alcohol use: Yes    Alcohol/week: 7.0 standard drinks of alcohol    Types: 7 Standard drinks or equivalent per week    Comment: 3 drinks at night    Drug use: No   Sexual activity: Not on file  Other Topics Concern   Not on file  Social History Narrative   Lives with wife   Social Determinants of Health   Financial Resource Strain: Not on file  Food Insecurity: Not on file  Transportation Needs: Not on file  Physical Activity: Not on file  Stress: Not on file  Social Connections: Not on file  Intimate Partner Violence: Not on file     Allergies  Allergen Reactions   Sulfamethoxazole     unsure   Sulfa Antibiotics Rash     Outpatient Medications Prior to Visit  Medication Sig Dispense Refill   albuterol (VENTOLIN HFA) 108 (90 Base) MCG/ACT inhaler Inhale 2 puffs into the lungs every 6 (six) hours as needed  for wheezing or shortness of breath. 8 g 6   allopurinol (ZYLOPRIM) 300 MG tablet TAKE 1 TABLET(300 MG) BY MOUTH DAILY 90 tablet 2   buPROPion (WELLBUTRIN XL) 300 MG 24 hr tablet Take 1 tablet (300 mg total) by mouth daily. 90 tablet 3   colestipol (COLESTID) 1 g tablet 2 tablets daily.     fluticasone (FLOVENT HFA) 110 MCG/ACT inhaler Inhale 2 puffs into the lungs 2 (two) times daily. 1 each 4   ibuprofen (ADVIL,MOTRIN) 200 MG tablet Take 800 mg by mouth every 6 (six) hours as needed.     meloxicam (MOBIC) 15 MG tablet Take 1 tablet (15 mg total) by mouth daily. With a meal. 30 tablet 3   propranolol (INDERAL) 20 MG tablet TAKE ONE TABLET DAILY FOR TREMORS 90 tablet 3   rosuvastatin (CRESTOR) 10 MG tablet Take 10 mg by mouth daily.     sertraline (ZOLOFT) 100 MG tablet TAKE 1 TABLET(100 MG) BY MOUTH DAILY 90 tablet 3   sildenafil (VIAGRA) 50 MG tablet Take 1 tablet (50 mg total) by mouth daily as needed for erectile dysfunction. 10 tablet 0   pantoprazole (PROTONIX) 40 MG tablet Take 1 tablet (40 mg total) by mouth daily. 30 tablet 3   atorvastatin (LIPITOR) 10 MG tablet TAKE 1 TABLET(10 MG) BY MOUTH DAILY 90 tablet 1   benzonatate (TESSALON) 100 MG capsule Take 1 capsule (100 mg total) by mouth 3 (three) times daily as needed for cough. (Patient not taking: Reported on 06/07/2022) 30 capsule 0   No facility-administered medications prior to visit.    Review of Systems  Constitutional:  Negative for chills, fever, malaise/fatigue and weight loss.  HENT:  Positive for congestion. Negative for sinus pain and sore throat.   Eyes: Negative.   Respiratory:  Positive for cough and sputum production. Negative for hemoptysis, shortness of breath and wheezing.   Cardiovascular:  Negative for chest pain, palpitations, orthopnea, claudication and leg swelling.  Gastrointestinal:  Negative for abdominal pain, diarrhea, heartburn, nausea and vomiting.  Genitourinary: Negative.   Musculoskeletal:  Negative.   Skin:  Negative for rash.  Neurological: Negative.   Endo/Heme/Allergies:  Positive for environmental allergies.  Psychiatric/Behavioral: Negative.     Objective:   Vitals:   07/04/22 1506  BP: 134/80  Pulse: 68  SpO2: 97%  Weight: 155 lb (70.3 kg)  Height: '5\' 7"'$  (1.702 m)     Physical Exam Constitutional:      General: He is  not in acute distress. HENT:     Head: Normocephalic and atraumatic.     Nose: Rhinorrhea present. No congestion.     Mouth/Throat:     Mouth: Mucous membranes are moist.     Pharynx: Oropharynx is clear.  Eyes:     General: No scleral icterus.    Conjunctiva/sclera: Conjunctivae normal.  Cardiovascular:     Rate and Rhythm: Normal rate and regular rhythm.     Pulses: Normal pulses.     Heart sounds: Normal heart sounds. No murmur heard. Pulmonary:     Effort: Pulmonary effort is normal.     Breath sounds: No wheezing, rhonchi or rales.  Musculoskeletal:     Right lower leg: No edema.     Left lower leg: No edema.  Skin:    General: Skin is warm and dry.  Neurological:     General: No focal deficit present.     Mental Status: He is alert.  Psychiatric:        Mood and Affect: Mood normal.        Behavior: Behavior normal.        Thought Content: Thought content normal.        Judgment: Judgment normal.    CBC    Component Value Date/Time   WBC 9.3 10/18/2020 1653   RBC 5.19 10/18/2020 1653   HGB 16.2 10/18/2020 1653   HCT 49.3 10/18/2020 1653   PLT 193.0 10/18/2020 1653   MCV 94.9 10/18/2020 1653   MCH 31.6 09/19/2020 1442   MCHC 32.8 10/18/2020 1653   RDW 13.4 10/18/2020 1653   LYMPHSABS 1.9 10/18/2020 1653   MONOABS 0.8 10/18/2020 1653   EOSABS 0.3 10/18/2020 1653   BASOSABS 0.1 10/18/2020 1653       Latest Ref Rng & Units 08/18/2020    9:39 AM 08/17/2019    9:58 AM 06/17/2019    4:46 PM  BMP  Glucose 65 - 99 mg/dL 104  81  100   BUN 7 - 25 mg/dL '16  19  13   '$ Creatinine 0.70 - 1.33 mg/dL 0.99  0.91  1.08    BUN/Creat Ratio 6 - 22 (calc) NOT APPLICABLE  NOT APPLICABLE  NOT APPLICABLE   Sodium 956 - 146 mmol/L 137  138  143   Potassium 3.5 - 5.3 mmol/L 3.9  4.2  4.2   Chloride 98 - 110 mmol/L 100  102  104   CO2 20 - 32 mmol/L '23  25  27   '$ Calcium 8.6 - 10.3 mg/dL 8.9  9.1  10.0    Chest imaging: CXR 06/07/22 No acute airspace opacity. Unchanged scarring and or atelectasis of the left lung base.  CT Chest 09/22/20 There is no pneumothorax. There is atelectasis at the lung bases vs reticulation. There is no focal infiltrate. No pleural effusion.  CXR 09/18/20 Ill-defined airspace opacity is noted in the anterior left base. Lungs elsewhere are clear. Heart size and pulmonary vascular normal. No adenopathy. No bone lesions.  PFT:     No data to display            Assessment & Plan:   Sinus congestion  Post-nasal drainage - Plan: ipratropium (ATROVENT) 0.03 % nasal spray  Discussion: Patrick Sanchez is a 61 year old male, former smoker who returns to pulmonary clinic for chronic productive cough.  He is experiencing an increase in seasonal allergies with sinus congestion, rhinitis and post nasal drainage. He does not appear to have reactive  airways involvement at this time.   He is to continue fluticasone nasal spray, 2 sprays per nostril daily. He is to start ipratropium nasal spray 2 sprays per nostril twice daily. He is to start fexofenadine '180mg'$  daily.   If he continues to have symptoms in 2-4 weeks we can consider starting him on low dose steroid taper.   Follow up in 1 year  Freda Jackson, MD [facility] Office: [phone]    Current Outpatient Medications:    albuterol (VENTOLIN HFA) 108 (90 Base) MCG/ACT inhaler, Inhale 2 puffs into the lungs every 6 (six) hours as needed for wheezing or shortness of breath., Disp: 8 g, Rfl: 6   allopurinol (ZYLOPRIM) 300 MG tablet, TAKE 1 TABLET(300 MG) BY MOUTH DAILY, Disp: 90 tablet, Rfl: 2   buPROPion  (WELLBUTRIN XL) 300 MG 24 hr tablet, Take 1 tablet (300 mg total) by mouth daily., Disp: 90 tablet, Rfl: 3   colestipol (COLESTID) 1 g tablet, 2 tablets daily., Disp: , Rfl:    fluticasone (FLOVENT HFA) 110 MCG/ACT inhaler, Inhale 2 puffs into the lungs 2 (two) times daily., Disp: 1 each, Rfl: 4   ibuprofen (ADVIL,MOTRIN) 200 MG tablet, Take 800 mg by mouth every 6 (six) hours as needed., Disp: , Rfl:    ipratropium (ATROVENT) 0.03 % nasal spray, Place 2 sprays into both nostrils every 12 (twelve) hours., Disp: 30 mL, Rfl: 12   meloxicam (MOBIC) 15 MG tablet, Take 1 tablet (15 mg total) by mouth daily. With a meal., Disp: 30 tablet, Rfl: 3   propranolol (INDERAL) 20 MG tablet, TAKE ONE TABLET DAILY FOR TREMORS, Disp: 90 tablet, Rfl: 3   rosuvastatin (CRESTOR) 10 MG tablet, Take 10 mg by mouth daily., Disp: , Rfl:    sertraline (ZOLOFT) 100 MG tablet, TAKE 1 TABLET(100 MG) BY MOUTH DAILY, Disp: 90 tablet, Rfl: 3   sildenafil (VIAGRA) 50 MG tablet, Take 1 tablet (50 mg total) by mouth daily as needed for erectile dysfunction., Disp: 10 tablet, Rfl: 0

## 2022-07-04 NOTE — Patient Instructions (Addendum)
Continue fluticasone nasal spray, 2 sprays per nostril daily  Start ipratropium nasal spray, 2 sprays per nostril twice a day - after 2-4 weeks of treatment can back off and use as needed  Start allegra (fexofenadine) 24hr '180mg'$  tab for allergies daily  Call us in 2-4 weeks if not much better and we will try a low dose steroid taper  Follow up in 1 year, call sooner if needed

## 2022-08-14 ENCOUNTER — Other Ambulatory Visit: Payer: Self-pay | Admitting: Nurse Practitioner

## 2022-08-14 DIAGNOSIS — N529 Male erectile dysfunction, unspecified: Secondary | ICD-10-CM

## 2022-08-21 DIAGNOSIS — E7800 Pure hypercholesterolemia, unspecified: Secondary | ICD-10-CM | POA: Diagnosis not present

## 2022-08-21 DIAGNOSIS — Z8546 Personal history of malignant neoplasm of prostate: Secondary | ICD-10-CM | POA: Diagnosis not present

## 2022-08-21 DIAGNOSIS — Z0000 Encounter for general adult medical examination without abnormal findings: Secondary | ICD-10-CM | POA: Diagnosis not present

## 2022-08-28 DIAGNOSIS — Z8249 Family history of ischemic heart disease and other diseases of the circulatory system: Secondary | ICD-10-CM | POA: Diagnosis not present

## 2022-08-28 DIAGNOSIS — E7800 Pure hypercholesterolemia, unspecified: Secondary | ICD-10-CM | POA: Diagnosis not present

## 2022-08-28 DIAGNOSIS — Z8546 Personal history of malignant neoplasm of prostate: Secondary | ICD-10-CM | POA: Diagnosis not present

## 2022-08-28 DIAGNOSIS — Z0000 Encounter for general adult medical examination without abnormal findings: Secondary | ICD-10-CM | POA: Diagnosis not present

## 2022-08-28 DIAGNOSIS — Z23 Encounter for immunization: Secondary | ICD-10-CM | POA: Diagnosis not present

## 2022-12-02 DIAGNOSIS — R000 Tachycardia, unspecified: Secondary | ICD-10-CM | POA: Diagnosis not present

## 2022-12-02 DIAGNOSIS — R0981 Nasal congestion: Secondary | ICD-10-CM | POA: Diagnosis not present

## 2022-12-02 DIAGNOSIS — J3489 Other specified disorders of nose and nasal sinuses: Secondary | ICD-10-CM | POA: Diagnosis not present

## 2022-12-12 DIAGNOSIS — H2513 Age-related nuclear cataract, bilateral: Secondary | ICD-10-CM | POA: Diagnosis not present

## 2022-12-12 DIAGNOSIS — H5203 Hypermetropia, bilateral: Secondary | ICD-10-CM | POA: Diagnosis not present

## 2023-08-29 DIAGNOSIS — Z8249 Family history of ischemic heart disease and other diseases of the circulatory system: Secondary | ICD-10-CM | POA: Diagnosis not present

## 2023-08-29 DIAGNOSIS — R000 Tachycardia, unspecified: Secondary | ICD-10-CM | POA: Diagnosis not present

## 2023-08-29 DIAGNOSIS — Z8546 Personal history of malignant neoplasm of prostate: Secondary | ICD-10-CM | POA: Diagnosis not present

## 2023-09-10 ENCOUNTER — Other Ambulatory Visit (HOSPITAL_COMMUNITY): Payer: Self-pay | Admitting: Registered Nurse

## 2023-09-10 DIAGNOSIS — Z0000 Encounter for general adult medical examination without abnormal findings: Secondary | ICD-10-CM | POA: Diagnosis not present

## 2023-09-10 DIAGNOSIS — E7800 Pure hypercholesterolemia, unspecified: Secondary | ICD-10-CM | POA: Diagnosis not present

## 2023-09-10 DIAGNOSIS — Z8249 Family history of ischemic heart disease and other diseases of the circulatory system: Secondary | ICD-10-CM

## 2023-09-10 DIAGNOSIS — Z8546 Personal history of malignant neoplasm of prostate: Secondary | ICD-10-CM | POA: Diagnosis not present

## 2023-09-17 ENCOUNTER — Encounter: Payer: Self-pay | Admitting: Pulmonary Disease

## 2023-09-17 ENCOUNTER — Ambulatory Visit: Payer: BC Managed Care – PPO | Admitting: Pulmonary Disease

## 2023-09-17 VITALS — BP 149/84 | HR 74 | Temp 97.9°F | Ht 67.0 in | Wt 159.8 lb

## 2023-09-17 DIAGNOSIS — J019 Acute sinusitis, unspecified: Secondary | ICD-10-CM

## 2023-09-17 MED ORDER — IPRATROPIUM BROMIDE 0.03 % NA SOLN: 2.0000 | Freq: Two times a day (BID) | NASAL | 12 refills | Status: DC

## 2023-09-17 MED ORDER — PREDNISONE 10 MG PO TABS: ORAL_TABLET | ORAL | 0 refills | Status: AC

## 2023-09-17 MED ORDER — FLUTICASONE PROPIONATE 50 MCG/ACT NA SUSP: 1.0000 | Freq: Every day | NASAL | 2 refills | Status: DC

## 2023-09-17 MED ORDER — AMOXICILLIN-POT CLAVULANATE 875-125 MG PO TABS: 1.0000 | ORAL_TABLET | Freq: Two times a day (BID) | ORAL | 0 refills | Status: DC

## 2023-09-17 NOTE — Progress Notes (Signed)
Synopsis: Referred in 10/2020 for abnormal chest radiograph  Subjective:   PATIENT ID: Patrick Sanchez GENDER: male DOB: 07/31/1961, MRN: [id number]  HPI  Chief Complaint  Patient presents with   Follow-up    Has had some nasal/sinus congestion, mild throat and ear irritation. He is using saline rinses a couple times a day and this helps some. He took augmentin in Sept 2024 for sinus infection.    Patrick Sanchez is a 62 year old male, former smoker who returns to pulmonary clinic for chronic productive cough.  He presents with persistent sinus congestion for several months. He describes internal irritation and a slight sore throat, with associated ear irritation. He has been managing his symptoms with regular sinus washes, Zyrtec, and guaifenesin. He has not been using any nasal sprays recently, although he has used Flonase in the past. He denies any nosebleeds. He also reports constant drainage down the back of his throat and states that his sinuses have not completely cleared up in years.  OV 07/04/22 Patient was seen by Rhunette Croft, NP 06/07/22 for cough and chest congestion. He started fluticasone nasal spray without much improvement in his sinus congestion, sinus drainage and cough. He denies wheezing and dyspnea. He was instructed to start OTC allergy medicine but did not start this. Chest x-ray 06/07/22 is without acute airspace opacity and with unchanged scarring/atelectasis of the left lung base.  He has lost weight due to increased physical activity since last year. He has cut down significant on alcohol use and is watching his diet. He has no issues with GERD at this time and is not using PPI therapy.  OV 02/12/21 His cough is significantly improved from last visit after starting flovent 2 puffs twice daily and also starting pantoprazole 40mg  daily after the visit on 11/27/20. He reports cutting back to 3-4 alcoholic drinks daily from 6-7. He feels the inhaler has helped the  most with his cough. He continues to have a mild cough with intermittent mucous production. He is following with Dr. Ewing Schlein at [facility] and has a EGD and C-scope planned for this August.    CT Chest 09/22/20 does not show interstitial involvement or opacities. No effusions or airway issues. Hypersensitivity panel was sent and is negative.   He smoked for 14-15 years where he smoked over a pack per day and he quit 31 years ago.    Past Medical History:  Diagnosis Date   Allergic rhinitis    Allergy    Asthma    Balance problem    Depression    Fatty tumor    right upper arm - no surgery to remove   Frozen shoulder    right   GERD (gastroesophageal reflux disease)    Hyperlipidemia    Knee pain    LEFT- healed with no issues as og 01-26-16   Neuromuscular disorder (HCC)    tremor   Prostate cancer (HCC)    Psoriasis    Tremor, essential    resting   Urinary hesitancy    "SHY BLADDER"     Family History  Problem Relation Age of Onset   Cancer Mother    Pancreatic cancer Mother    Diabetes Father    Heart failure Father    Diabetes Sister    Diabetes Brother    Colon cancer Neg Hx    Colon polyps Neg Hx      Social History   Socioeconomic History   Marital status: Married  Spouse name: Not on file   Number of children: 2   Years of education: Not on file   Highest education level: Not on file  Occupational History   Not on file  Tobacco Use   Smoking status: Former    Current packs/day: 0.00    Types: Cigarettes    Quit date: 03/26/1989    Years since quitting: 34.5   Smokeless tobacco: Never   Tobacco comments:    long ago with no regularity   Substance and Sexual Activity   Alcohol use: Yes    Alcohol/week: 7.0 standard drinks of alcohol    Types: 7 Standard drinks or equivalent per week    Comment: 3 drinks at night    Drug use: No   Sexual activity: Not on file  Other Topics Concern   Not on file  Social History Narrative   Lives with wife    Social Determinants of Health   Financial Resource Strain: Not on file  Food Insecurity: Not on file  Transportation Needs: Not on file  Physical Activity: Not on file  Stress: Not on file  Social Connections: Not on file  Intimate Partner Violence: Not on file     Allergies  Allergen Reactions   Sulfamethoxazole     unsure   Sulfa Antibiotics Rash     Outpatient Medications Prior to Visit  Medication Sig Dispense Refill   albuterol (VENTOLIN HFA) 108 (90 Base) MCG/ACT inhaler Inhale 2 puffs into the lungs every 6 (six) hours as needed for wheezing or shortness of breath. 8 g 6   buPROPion (WELLBUTRIN XL) 300 MG 24 hr tablet Take 1 tablet (300 mg total) by mouth daily. 90 tablet 3   cetirizine (ZYRTEC) 10 MG tablet Take 10 mg by mouth daily.     colestipol (COLESTID) 1 g tablet 2 tablets daily.     fluticasone (FLOVENT HFA) 110 MCG/ACT inhaler Inhale 2 puffs into the lungs 2 (two) times daily. (Patient taking differently: Inhale 2 puffs into the lungs 2 (two) times daily as needed.) 1 each 4   ibuprofen (ADVIL,MOTRIN) 200 MG tablet Take 800 mg by mouth every 6 (six) hours as needed.     propranolol (INDERAL) 20 MG tablet TAKE ONE TABLET DAILY FOR TREMORS 90 tablet 3   rosuvastatin (CRESTOR) 10 MG tablet Take 10 mg by mouth daily.     sertraline (ZOLOFT) 100 MG tablet TAKE 1 TABLET(100 MG) BY MOUTH DAILY 90 tablet 3   sildenafil (VIAGRA) 50 MG tablet Take 1 tablet (50 mg total) by mouth daily as needed for erectile dysfunction. 10 tablet 0   allopurinol (ZYLOPRIM) 300 MG tablet TAKE 1 TABLET(300 MG) BY MOUTH DAILY 90 tablet 2   ipratropium (ATROVENT) 0.03 % nasal spray Place 2 sprays into both nostrils every 12 (twelve) hours. 30 mL 12   meloxicam (MOBIC) 15 MG tablet Take 1 tablet (15 mg total) by mouth daily. With a meal. 30 tablet 3   No facility-administered medications prior to visit.    Review of Systems  Constitutional:  Negative for chills, fever, malaise/fatigue and  weight loss.  HENT:  Positive for congestion. Negative for sinus pain and sore throat.   Eyes: Negative.   Respiratory:  Positive for cough and sputum production. Negative for hemoptysis, shortness of breath and wheezing.   Cardiovascular:  Negative for chest pain, palpitations, orthopnea, claudication and leg swelling.  Gastrointestinal:  Negative for abdominal pain, diarrhea, heartburn, nausea and vomiting.  Genitourinary: Negative.  Musculoskeletal: Negative.   Skin:  Negative for rash.  Neurological: Negative.   Endo/Heme/Allergies:  Positive for environmental allergies.  Psychiatric/Behavioral: Negative.     Objective:   Vitals:   09/17/23 1522  BP: (!) 149/84  Pulse: 74  Temp: 97.9 F (36.6 C)  TempSrc: Oral  SpO2: 95%  Weight: 159 lb 12.8 oz (72.5 kg)  Height: 5\' 7"  (1.702 m)     Physical Exam Constitutional:      General: He is not in acute distress. HENT:     Head: Normocephalic and atraumatic.     Nose: Congestion and rhinorrhea present.     Mouth/Throat:     Mouth: Mucous membranes are moist.     Pharynx: Oropharynx is clear.  Eyes:     General: No scleral icterus.    Conjunctiva/sclera: Conjunctivae normal.  Cardiovascular:     Rate and Rhythm: Normal rate and regular rhythm.     Pulses: Normal pulses.     Heart sounds: Normal heart sounds. No murmur heard. Pulmonary:     Effort: Pulmonary effort is normal.     Breath sounds: No wheezing, rhonchi or rales.  Musculoskeletal:     Right lower leg: No edema.     Left lower leg: No edema.  Skin:    General: Skin is warm and dry.  Neurological:     General: No focal deficit present.     Mental Status: He is alert.    CBC    Component Value Date/Time   WBC 9.3 10/18/2020 1653   RBC 5.19 10/18/2020 1653   HGB 16.2 10/18/2020 1653   HCT 49.3 10/18/2020 1653   PLT 193.0 10/18/2020 1653   MCV 94.9 10/18/2020 1653   MCH 31.6 09/19/2020 1442   MCHC 32.8 10/18/2020 1653   RDW 13.4 10/18/2020 1653    LYMPHSABS 1.9 10/18/2020 1653   MONOABS 0.8 10/18/2020 1653   EOSABS 0.3 10/18/2020 1653   BASOSABS 0.1 10/18/2020 1653       Latest Ref Rng & Units 08/18/2020    9:39 AM 08/17/2019    9:58 AM 06/17/2019    4:46 PM  BMP  Glucose 65 - 99 mg/dL 161  81  096   BUN 7 - 25 mg/dL 16  19  13    Creatinine 0.70 - 1.33 mg/dL 0.45  4.09  8.11   BUN/Creat Ratio 6 - 22 (calc) NOT APPLICABLE  NOT APPLICABLE  NOT APPLICABLE   Sodium 135 - 146 mmol/L 137  138  143   Potassium 3.5 - 5.3 mmol/L 3.9  4.2  4.2   Chloride 98 - 110 mmol/L 100  102  104   CO2 20 - 32 mmol/L 23  25  27    Calcium 8.6 - 10.3 mg/dL 8.9  9.1  91.4    Chest imaging: CXR 06/07/22 No acute airspace opacity. Unchanged scarring and or atelectasis of the left lung base.  CT Chest 09/22/20 There is no pneumothorax. There is atelectasis at the lung bases vs reticulation. There is no focal infiltrate. No pleural effusion.  CXR 09/18/20 Ill-defined airspace opacity is noted in the anterior left base. Lungs elsewhere are clear. Heart size and pulmonary vascular normal. No adenopathy. No bone lesions.  PFT:     No data to display            Assessment & Plan:   Subacute sinusitis, unspecified location - Plan: predniSONE (DELTASONE) 10 MG tablet, amoxicillin-clavulanate (AUGMENTIN) 875-125 MG tablet, fluticasone (FLONASE) 50 MCG/ACT nasal  spray, ipratropium (ATROVENT) 0.03 % nasal spray  Discussion: Patrick Sanchez is a 62 year old male, former smoker who returns to pulmonary clinic for chronic productive cough.  Chronic Sinusitis Persistent symptoms despite nasal rinses, Zyrtec, and guaifenesin. No relief from symptoms for several years. No current use of nasal sprays. Last antibiotic course in September provided some relief. -Start Augmentin for 10 days. -Start Prednisone taper for 12 days. -Resume Flonase, one spray per nostril daily. -Start Ipratropium nasal spray, two sprays per nostril twice a day. -Continue nasal  rinses, to be done prior to nasal sprays.  Follow up in 1 year  Melody Comas, MD [facility] Office: [phone]    Current Outpatient Medications:    albuterol (VENTOLIN HFA) 108 (90 Base) MCG/ACT inhaler, Inhale 2 puffs into the lungs every 6 (six) hours as needed for wheezing or shortness of breath., Disp: 8 g, Rfl: 6   amoxicillin-clavulanate (AUGMENTIN) 875-125 MG tablet, Take 1 tablet by mouth 2 (two) times daily., Disp: 20 tablet, Rfl: 0   buPROPion (WELLBUTRIN XL) 300 MG 24 hr tablet, Take 1 tablet (300 mg total) by mouth daily., Disp: 90 tablet, Rfl: 3   cetirizine (ZYRTEC) 10 MG tablet, Take 10 mg by mouth daily., Disp: , Rfl:    colestipol (COLESTID) 1 g tablet, 2 tablets daily., Disp: , Rfl:    fluticasone (FLONASE) 50 MCG/ACT nasal spray, Place 1 spray into both nostrils daily., Disp: 16 g, Rfl: 2   fluticasone (FLOVENT HFA) 110 MCG/ACT inhaler, Inhale 2 puffs into the lungs 2 (two) times daily. (Patient taking differently: Inhale 2 puffs into the lungs 2 (two) times daily as needed.), Disp: 1 each, Rfl: 4   ibuprofen (ADVIL,MOTRIN) 200 MG tablet, Take 800 mg by mouth every 6 (six) hours as needed., Disp: , Rfl:    ipratropium (ATROVENT) 0.03 % nasal spray, Place 2 sprays into both nostrils every 12 (twelve) hours., Disp: 30 mL, Rfl: 12   predniSONE (DELTASONE) 10 MG tablet, Take 4 tablets (40 mg total) by mouth daily with breakfast for 3 days, THEN 3 tablets (30 mg total) daily with breakfast for 3 days, THEN 2 tablets (20 mg total) daily with breakfast for 3 days, THEN 1 tablet (10 mg total) daily with breakfast for 3 days., Disp: 30 tablet, Rfl: 0   propranolol (INDERAL) 20 MG tablet, TAKE ONE TABLET DAILY FOR TREMORS, Disp: 90 tablet, Rfl: 3   rosuvastatin (CRESTOR) 10 MG tablet, Take 10 mg by mouth daily., Disp: , Rfl:    sertraline (ZOLOFT) 100 MG tablet, TAKE 1 TABLET(100 MG) BY MOUTH DAILY, Disp: 90 tablet, Rfl: 3   sildenafil (VIAGRA) 50 MG  tablet, Take 1 tablet (50 mg total) by mouth daily as needed for erectile dysfunction., Disp: 10 tablet, Rfl: 0

## 2023-09-17 NOTE — Patient Instructions (Addendum)
Start augmentin 1 tab twice daily for 10 days  Start prednisone taper 4 tabs x 3 days 3 tabs x 3 days 2 tabs x 3 days 1 tab x 3 days  Start fluticasone nasal spray, 1 spray per nostril daily  Start ipratropium 2 sprays per nostril twice daily - ok to stop this nasal spray once feeling better  Follow up in 1 year, call sooner if needed

## 2023-10-15 ENCOUNTER — Ambulatory Visit (HOSPITAL_COMMUNITY): Payer: BC Managed Care – PPO

## 2023-10-15 ENCOUNTER — Encounter (HOSPITAL_COMMUNITY): Payer: Self-pay

## 2023-10-15 DIAGNOSIS — F4329 Adjustment disorder with other symptoms: Secondary | ICD-10-CM | POA: Diagnosis not present

## 2023-10-21 DIAGNOSIS — F4329 Adjustment disorder with other symptoms: Secondary | ICD-10-CM | POA: Diagnosis not present

## 2023-10-21 DIAGNOSIS — R051 Acute cough: Secondary | ICD-10-CM | POA: Diagnosis not present

## 2023-10-21 DIAGNOSIS — R058 Other specified cough: Secondary | ICD-10-CM | POA: Diagnosis not present

## 2023-11-28 DIAGNOSIS — H938X3 Other specified disorders of ear, bilateral: Secondary | ICD-10-CM | POA: Diagnosis not present

## 2023-11-28 DIAGNOSIS — J3489 Other specified disorders of nose and nasal sinuses: Secondary | ICD-10-CM | POA: Diagnosis not present

## 2024-01-26 ENCOUNTER — Ambulatory Visit: Payer: Self-pay | Admitting: General Surgery

## 2024-01-26 NOTE — H&P (View-Only) (Signed)
 Chief Complaint: New Consultation (Left inginal hernia)     History of Present Illness: Emit Kuenzel is a 63 y.o. male who is seen today as an office consultation at the request of Dr. Luke Salaam for evaluation of New Consultation (Left inginal hernia) .   History of Present Illness Mr. Mitchelle, a patient with a history of a hernia at the age of 48 and a prostate removal, presents with a hernia in the groin that he noticed about three weeks ago. The hernia was initially mistaken for a tumor due to its location. The hernia size varies, sometimes appearing small and at other times swelling to the size of a large egg. The patient has not noticed if the hernia flattens out when he lays down. The patient's current activity level is low due to the hernia, with the most activity being walking his 120-pound dog. The patient also spends a lot of time on his feet cooking.     Review of Systems: A complete review of systems was obtained from the patient.  I have reviewed this information and discussed as appropriate with the patient.  See HPI as well for other ROS.  Review of Systems  Constitutional:  Negative for fever.  HENT:  Negative for congestion.   Eyes:  Negative for blurred vision.  Respiratory:  Negative for cough, shortness of breath and wheezing.   Cardiovascular:  Negative for chest pain and palpitations.  Gastrointestinal:  Negative for heartburn.  Genitourinary:  Negative for dysuria.  Musculoskeletal:  Negative for myalgias.  Skin:  Negative for rash.  Neurological:  Negative for dizziness and headaches.  Psychiatric/Behavioral:  Negative for depression and suicidal ideas.   All other systems reviewed and are negative.     Medical History: Past Medical History: Diagnosis Date  GERD (gastroesophageal reflux disease)   Hyperlipidemia   Hypertension    There is no problem list on file for this patient.   Past Surgical History: Procedure Laterality Date  HERNIA  REPAIR    PROSTATE SURGERY      Allergies Allergen Reactions  Sulfamethoxazole Other (See Comments)   unsure   Current Outpatient Medications on File Prior to Visit Medication Sig Dispense Refill  buPROPion  (WELLBUTRIN  XL) 150 MG XL tablet Take 150 mg by mouth once daily    ipratropium (ATROVENT ) 21 mcg (0.03 %) nasal spray Place 2 sprays into one nostril    omeprazole (PRILOSEC) 40 MG DR capsule Take 40 mg by mouth every morning before breakfast (0630)    propranoloL  (INDERAL ) 20 MG tablet 1 (one) tablet by mouth daily, max daily dose 1 Tablet    rosuvastatin (CRESTOR) 10 MG tablet Take 10 mg by mouth once daily    sertraline  (ZOLOFT ) 100 MG tablet Take 1 tablet by mouth once daily    sildenafiL  (VIAGRA ) 100 MG tablet take ONE Tablet as needed 30 mins. BEFORE sex. No more THAN ONCE DAY.]    No current facility-administered medications on file prior to visit.   Family History Problem Relation Age of Onset  Diabetes Mother   Diabetes Father   Diabetes Sister   Diabetes Brother     Social History  Tobacco Use Smoking Status Former  Types: Cigarettes Smokeless Tobacco Never    Social History  Socioeconomic History  Marital status: Married Tobacco Use  Smoking status: Former   Types: Cigarettes  Smokeless tobacco: Never Vaping Use  Vaping status: Never Used Substance and Sexual Activity  Alcohol use: Yes  Drug use: Never   Objective:  Vitals:  01/26/24 1448 BP: (!) 150/96 Pulse: 86 Temp: 36.8 C (98.3 F) SpO2: 97% Weight: 74.5 kg (164 lb 3.2 oz) Height: 170.2 cm (5\' 7" ) PainSc: 0-No pain   Body mass index is 25.72 kg/m. Physical Exam Constitutional:      Appearance: Normal appearance.  HENT:     Head: Normocephalic and atraumatic.     Nose: Nose normal. No congestion.     Mouth/Throat:     Mouth: Mucous membranes are moist.     Pharynx: Oropharynx is clear.  Eyes:     Pupils: Pupils are equal, round, and reactive to light.  Cardiovascular:      Rate and Rhythm: Normal rate and regular rhythm.     Pulses: Normal pulses.     Heart sounds: Normal heart sounds. No murmur heard.    No friction rub. No gallop.  Pulmonary:     Effort: Pulmonary effort is normal. No respiratory distress.     Breath sounds: Normal breath sounds. No stridor. No wheezing, rhonchi or rales.  Abdominal:     General: Abdomen is flat.     Hernia: A hernia is present. Hernia is present in the left inguinal area and right inguinal area.  Musculoskeletal:        General: Normal range of motion.     Cervical back: Normal range of motion.  Skin:    General: Skin is warm and dry.  Neurological:     General: No focal deficit present.     Mental Status: He is alert and oriented to person, place, and time.  Psychiatric:        Mood and Affect: Mood normal.        Thought Content: Thought content normal.       Assessment and Plan: Diagnoses and all orders for this visit:  Unilateral inguinal hernia without obstruction or gangrene, recurrence not specified    Lonzo Saulter is a 63 y.o. male   1.  We will proceed to the OR for a open right inguinal hernia repair with mesh. 2. All risks and benefits were discussed with the patient, to generally include infection, bleeding, damage to surrounding structures, acute and chronic nerve pain, and recurrence. Alternatives were offered and described.  All questions were answered and the patient voiced understanding of the procedure and wishes to proceed at this point.       No follow-ups on file.  Shela Derby, MD, [facility], [facility]

## 2024-01-26 NOTE — H&P (Signed)
 Chief Complaint: New Consultation (Left inginal hernia)     History of Present Illness: Patrick Sanchez is a 63 y.o. male who is seen today as an office consultation at the request of Dr. Luke Salaam for evaluation of New Consultation (Left inginal hernia) .   History of Present Illness Patrick Sanchez, a patient with a history of a hernia at the age of 48 and a prostate removal, presents with a hernia in the groin that he noticed about three weeks ago. The hernia was initially mistaken for a tumor due to its location. The hernia size varies, sometimes appearing small and at other times swelling to the size of a large egg. The patient has not noticed if the hernia flattens out when he lays down. The patient's current activity level is low due to the hernia, with the most activity being walking his 120-pound dog. The patient also spends a lot of time on his feet cooking.     Review of Systems: A complete review of systems was obtained from the patient.  I have reviewed this information and discussed as appropriate with the patient.  See HPI as well for other ROS.  Review of Systems  Constitutional:  Negative for fever.  HENT:  Negative for congestion.   Eyes:  Negative for blurred vision.  Respiratory:  Negative for cough, shortness of breath and wheezing.   Cardiovascular:  Negative for chest pain and palpitations.  Gastrointestinal:  Negative for heartburn.  Genitourinary:  Negative for dysuria.  Musculoskeletal:  Negative for myalgias.  Skin:  Negative for rash.  Neurological:  Negative for dizziness and headaches.  Psychiatric/Behavioral:  Negative for depression and suicidal ideas.   All other systems reviewed and are negative.     Medical History: Past Medical History: Diagnosis Date  GERD (gastroesophageal reflux disease)   Hyperlipidemia   Hypertension    There is no problem list on file for this patient.   Past Surgical History: Procedure Laterality Date  HERNIA  REPAIR    PROSTATE SURGERY      Allergies Allergen Reactions  Sulfamethoxazole Other (See Comments)   unsure   Current Outpatient Medications on File Prior to Visit Medication Sig Dispense Refill  buPROPion  (WELLBUTRIN  XL) 150 MG XL tablet Take 150 mg by mouth once daily    ipratropium (ATROVENT ) 21 mcg (0.03 %) nasal spray Place 2 sprays into one nostril    omeprazole (PRILOSEC) 40 MG DR capsule Take 40 mg by mouth every morning before breakfast (0630)    propranoloL  (INDERAL ) 20 MG tablet 1 (one) tablet by mouth daily, max daily dose 1 Tablet    rosuvastatin (CRESTOR) 10 MG tablet Take 10 mg by mouth once daily    sertraline  (ZOLOFT ) 100 MG tablet Take 1 tablet by mouth once daily    sildenafiL  (VIAGRA ) 100 MG tablet take ONE Tablet as needed 30 mins. BEFORE sex. No more THAN ONCE DAY.]    No current facility-administered medications on file prior to visit.   Family History Problem Relation Age of Onset  Diabetes Mother   Diabetes Father   Diabetes Sister   Diabetes Brother     Social History  Tobacco Use Smoking Status Former  Types: Cigarettes Smokeless Tobacco Never    Social History  Socioeconomic History  Marital status: Married Tobacco Use  Smoking status: Former   Types: Cigarettes  Smokeless tobacco: Never Vaping Use  Vaping status: Never Used Substance and Sexual Activity  Alcohol use: Yes  Drug use: Never   Objective:  Vitals:  01/26/24 1448 BP: (!) 150/96 Pulse: 86 Temp: 36.8 C (98.3 F) SpO2: 97% Weight: 74.5 kg (164 lb 3.2 oz) Height: 170.2 cm (5\' 7" ) PainSc: 0-No pain   Body mass index is 25.72 kg/m. Physical Exam Constitutional:      Appearance: Normal appearance.  HENT:     Head: Normocephalic and atraumatic.     Nose: Nose normal. No congestion.     Mouth/Throat:     Mouth: Mucous membranes are moist.     Pharynx: Oropharynx is clear.  Eyes:     Pupils: Pupils are equal, round, and reactive to light.  Cardiovascular:      Rate and Rhythm: Normal rate and regular rhythm.     Pulses: Normal pulses.     Heart sounds: Normal heart sounds. No murmur heard.    No friction rub. No gallop.  Pulmonary:     Effort: Pulmonary effort is normal. No respiratory distress.     Breath sounds: Normal breath sounds. No stridor. No wheezing, rhonchi or rales.  Abdominal:     General: Abdomen is flat.     Hernia: A hernia is present. Hernia is present in the left inguinal area and right inguinal area.  Musculoskeletal:        General: Normal range of motion.     Cervical back: Normal range of motion.  Skin:    General: Skin is warm and dry.  Neurological:     General: No focal deficit present.     Mental Status: He is alert and oriented to person, place, and time.  Psychiatric:        Mood and Affect: Mood normal.        Thought Content: Thought content normal.       Assessment and Plan: Diagnoses and all orders for this visit:  Unilateral inguinal hernia without obstruction or gangrene, recurrence not specified    Patrick Sanchez is a 63 y.o. male   1.  We will proceed to the OR for a open right inguinal hernia repair with mesh. 2. All risks and benefits were discussed with the patient, to generally include infection, bleeding, damage to surrounding structures, acute and chronic nerve pain, and recurrence. Alternatives were offered and described.  All questions were answered and the patient voiced understanding of the procedure and wishes to proceed at this point.       No follow-ups on file.  Shela Derby, MD, [facility], [place] [facility]

## 2024-02-03 NOTE — Pre-Procedure Instructions (Signed)
 Surgical Instructions   Your procedure is scheduled on Tuesday May 6th. Report to [facility] Main Entrance "A" at 9:00 A.M., then check in with the Admitting office. Any questions or running late day of surgery: call [phone]  Questions prior to your surgery date: call [phone], Monday-Friday, 8am-4pm. If you experience any cold or flu symptoms such as cough, fever, chills, shortness of breath, etc. between now and your scheduled surgery, please notify us  at the above number.     Remember:  Do not eat after midnight the night before your surgery   You may drink clear liquids until 8:00 the morning of your surgery.   Clear liquids allowed are: Water , Non-Citrus Juices (without pulp), Carbonated Beverages, Clear Tea (no milk, honey, etc.), Black Coffee Only (NO MILK, CREAM OR POWDERED CREAMER of any kind), and Gatorade.     Take these medicines the morning of surgery with A SIP OF WATER   buPROPion  (WELLBUTRIN  XL)  omeprazole (PRILOSEC)  propranolol  (INDERAL )  sertraline  (ZOLOFT )   May take these medicines IF NEEDED: cetirizine (ZYRTEC)    One week prior to surgery, STOP taking any Aspirin (unless otherwise instructed by your surgeon) Aleve, Naproxen, Ibuprofen, Motrin, Advil, Goody's, BC's, all herbal medications, fish oil, and non-prescription vitamins.                     Do NOT Smoke (Tobacco/Vaping) for 24 hours prior to your procedure.  If you use a CPAP at night, you may bring your mask/headgear for your overnight stay.   You will be asked to remove any contacts, glasses, piercing's, hearing aid's, dentures/partials prior to surgery. Please bring cases for these items if needed.    Patients discharged the day of surgery will not be allowed to drive home, and someone needs to stay with them for 24 hours.  SURGICAL WAITING ROOM VISITATION Patients may have no more than 2 support people in the waiting area - these visitors may rotate.   Pre-op nurse will coordinate  an appropriate time for 1 ADULT support person, who may not rotate, to accompany patient in pre-op.  Children under the age of 15 must have an adult with them who is not the patient and must remain in the main waiting area with an adult.  If the patient needs to stay at the hospital during part of their recovery, the visitor guidelines for inpatient rooms apply.  Please refer to the [facility] website for the visitor guidelines for any additional information.   If you received a COVID test during your pre-op visit  it is requested that you wear a mask when out in public, stay away from anyone that may not be feeling well and notify your surgeon if you develop symptoms. If you have been in contact with anyone that has tested positive in the last 10 days please notify you surgeon.      Pre-operative CHG Bathing Instructions   You can play a key role in reducing the risk of infection after surgery. Your skin needs to be as free of germs as possible. You can reduce the number of germs on your skin by washing with CHG (chlorhexidine gluconate) soap before surgery. CHG is an antiseptic soap that kills germs and continues to kill germs even after washing.   DO NOT use if you have an allergy to chlorhexidine/CHG or antibacterial soaps. If your skin becomes reddened or irritated, stop using the CHG and notify one of our RNs at [phone].  TAKE A SHOWER THE NIGHT BEFORE SURGERY AND THE DAY OF SURGERY    Please keep in mind the following:  DO NOT shave, including legs and underarms, 48 hours prior to surgery.   You may shave your face before/day of surgery.  Place clean sheets on your bed the night before surgery Use a clean washcloth (not used since being washed) for each shower. DO NOT sleep with pet's night before surgery.  CHG Shower Instructions:  Wash your face and private area with normal soap. If you choose to wash your hair, wash first with your normal shampoo.  After you  use shampoo/soap, rinse your hair and body thoroughly to remove shampoo/soap residue.  Turn the water  OFF and apply half the bottle of CHG soap to a CLEAN washcloth.  Apply CHG soap ONLY FROM YOUR NECK DOWN TO YOUR TOES (washing for 3-5 minutes)  DO NOT use CHG soap on face, private areas, open wounds, or sores.  Pay special attention to the area where your surgery is being performed.  If you are having back surgery, having someone wash your back for you may be helpful. Wait 2 minutes after CHG soap is applied, then you may rinse off the CHG soap.  Pat dry with a clean towel  Put on clean pajamas    Additional instructions for the day of surgery: DO NOT APPLY any lotions, deodorants, cologne, or perfumes.   Do not wear jewelry or makeup Do not wear nail polish, gel polish, artificial nails, or any other type of covering on natural nails (fingers and toes) Do not bring valuables to the hospital. [facility] is not responsible for valuables/personal belongings. Put on clean/comfortable clothes.  Please brush your teeth.  Ask your nurse before applying any prescription medications to the skin.

## 2024-02-03 NOTE — Pre-Procedure Instructions (Signed)
 Surgical Instructions   Your procedure is scheduled on Tuesday May 6th. Report to [facility] Main Entrance "A" at 9:00 A.M., then check in with the Admitting office. Any questions or running late day of surgery: call [phone]  Questions prior to your surgery date: call [phone], Monday-Friday, 8am-4pm. If you experience any cold or flu symptoms such as cough, fever, chills, shortness of breath, etc. between now and your scheduled surgery, please notify us  at the above number.     Remember:  Do not eat after midnight the night before your surgery   You may drink clear liquids until 8:00 the morning of your surgery.   Clear liquids allowed are: Water , Non-Citrus Juices (without pulp), Carbonated Beverages, Clear Tea (no milk, honey, etc.), Black Coffee Only (NO MILK, CREAM OR POWDERED CREAMER of any kind), and Gatorade.  Patient Instructions  The night before surgery:  No food after midnight. ONLY clear liquids after midnight  The day of surgery (if you do NOT have diabetes):  Drink ONE (1) Pre-Surgery Clear Ensure by 08:00 AM the morning of surgery. Drink in one sitting. Do not sip.  This drink was given to you during your hospital  pre-op appointment visit.  Nothing else to drink after completing the  Pre-Surgery Clear Ensure.          If you have questions, please contact your surgeon's office.     Take these medicines the morning of surgery with A SIP OF WATER   buPROPion  (WELLBUTRIN  XL)  omeprazole (PRILOSEC)  propranolol  (INDERAL )  sertraline  (ZOLOFT )   May take these medicines IF NEEDED: cetirizine (ZYRTEC)    One week prior to surgery, STOP taking any Aspirin (unless otherwise instructed by your surgeon) Aleve, Naproxen, Ibuprofen, Motrin, Advil, Goody's, BC's, all herbal medications, fish oil, and non-prescription vitamins.                     Do NOT Smoke (Tobacco/Vaping) for 24 hours prior to your procedure.  If you use a CPAP at night, you may bring  your mask/headgear for your overnight stay.   You will be asked to remove any contacts, glasses, piercing's, hearing aid's, dentures/partials prior to surgery. Please bring cases for these items if needed.    Patients discharged the day of surgery will not be allowed to drive home, and someone needs to stay with them for 24 hours.  SURGICAL WAITING ROOM VISITATION Patients may have no more than 2 support people in the waiting area - these visitors may rotate.   Pre-op nurse will coordinate an appropriate time for 1 ADULT support person, who may not rotate, to accompany patient in pre-op.  Children under the age of 37 must have an adult with them who is not the patient and must remain in the main waiting area with an adult.  If the patient needs to stay at the hospital during part of their recovery, the visitor guidelines for inpatient rooms apply.  Please refer to the [facility] website for the visitor guidelines for any additional information.   If you received a COVID test during your pre-op visit  it is requested that you wear a mask when out in public, stay away from anyone that may not be feeling well and notify your surgeon if you develop symptoms. If you have been in contact with anyone that has tested positive in the last 10 days please notify you surgeon.      Pre-operative CHG Bathing Instructions   You can  play a key role in reducing the risk of infection after surgery. Your skin needs to be as free of germs as possible. You can reduce the number of germs on your skin by washing with CHG (chlorhexidine gluconate) soap before surgery. CHG is an antiseptic soap that kills germs and continues to kill germs even after washing.   DO NOT use if you have an allergy to chlorhexidine/CHG or antibacterial soaps. If your skin becomes reddened or irritated, stop using the CHG and notify one of our RNs at [phone].              TAKE A SHOWER THE NIGHT BEFORE SURGERY AND THE DAY OF  SURGERY    Please keep in mind the following:  DO NOT shave, including legs and underarms, 48 hours prior to surgery.   You may shave your face before/day of surgery.  Place clean sheets on your bed the night before surgery Use a clean washcloth (not used since being washed) for each shower. DO NOT sleep with pet's night before surgery.  CHG Shower Instructions:  Wash your face and private area with normal soap. If you choose to wash your hair, wash first with your normal shampoo.  After you use shampoo/soap, rinse your hair and body thoroughly to remove shampoo/soap residue.  Turn the water  OFF and apply half the bottle of CHG soap to a CLEAN washcloth.  Apply CHG soap ONLY FROM YOUR NECK DOWN TO YOUR TOES (washing for 3-5 minutes)  DO NOT use CHG soap on face, private areas, open wounds, or sores.  Pay special attention to the area where your surgery is being performed.  If you are having back surgery, having someone wash your back for you may be helpful. Wait 2 minutes after CHG soap is applied, then you may rinse off the CHG soap.  Pat dry with a clean towel  Put on clean pajamas    Additional instructions for the day of surgery: DO NOT APPLY any lotions, deodorants, cologne, or perfumes.   Do not wear jewelry or makeup Do not wear nail polish, gel polish, artificial nails, or any other type of covering on natural nails (fingers and toes) Do not bring valuables to the hospital. [facility] is not responsible for valuables/personal belongings. Put on clean/comfortable clothes.  Please brush your teeth.  Ask your nurse before applying any prescription medications to the skin.

## 2024-02-04 ENCOUNTER — Other Ambulatory Visit: Payer: Self-pay

## 2024-02-04 ENCOUNTER — Encounter (HOSPITAL_COMMUNITY): Payer: Self-pay

## 2024-02-04 ENCOUNTER — Encounter (HOSPITAL_COMMUNITY)
Admission: RE | Admit: 2024-02-04 | Discharge: 2024-02-04 | Disposition: A | Discharge status: Home / Self Care | Source: Ambulatory Visit | Attending: General Surgery | Admitting: General Surgery

## 2024-02-04 VITALS — BP 142/87 | HR 97 | Temp 98.2°F | Resp 18 | Ht 67.0 in | Wt 163.4 lb

## 2024-02-04 DIAGNOSIS — F1010 Alcohol abuse, uncomplicated: Secondary | ICD-10-CM | POA: Insufficient documentation

## 2024-02-04 DIAGNOSIS — Z01818 Encounter for other preprocedural examination: Secondary | ICD-10-CM

## 2024-02-04 DIAGNOSIS — Z01812 Encounter for preprocedural laboratory examination: Secondary | ICD-10-CM | POA: Insufficient documentation

## 2024-02-04 LAB — CBC
HCT: 49.8 % (ref 39.0–52.0)
Hemoglobin: 16.5 g/dL (ref 13.0–17.0)
MCH: 30.9 pg (ref 26.0–34.0)
MCHC: 33.1 g/dL (ref 30.0–36.0)
MCV: 93.3 fL (ref 80.0–100.0)
Platelets: 207 10*3/uL (ref 150–400)
RBC: 5.34 MIL/uL (ref 4.22–5.81)
RDW: 12.9 % (ref 11.5–15.5)
WBC: 9.4 10*3/uL (ref 4.0–10.5)
nRBC: 0 % (ref 0.0–0.2)

## 2024-02-04 LAB — COMPREHENSIVE METABOLIC PANEL WITH GFR
ALT: 39 U/L (ref 0–44)
AST: 27 U/L (ref 15–41)
Albumin: 3.9 g/dL (ref 3.5–5.0)
Alkaline Phosphatase: 61 U/L (ref 38–126)
Anion gap: 10 (ref 5–15)
BUN: 19 mg/dL (ref 8–23)
CO2: 26 mmol/L (ref 22–32)
Calcium: 9.5 mg/dL (ref 8.9–10.3)
Chloride: 102 mmol/L (ref 98–111)
Creatinine, Ser: 1.03 mg/dL (ref 0.61–1.24)
GFR, Estimated: >60 mL/min (ref >60)
Glucose, Bld: 105 mg/dL — ABNORMAL HIGH (ref 70–99)
Potassium: 4.7 mmol/L (ref 3.5–5.1)
Sodium: 138 mmol/L (ref 135–145)
Total Bilirubin: 0.8 mg/dL (ref 0.0–1.2)
Total Protein: 7.1 g/dL (ref 6.5–8.1)

## 2024-02-04 NOTE — Progress Notes (Signed)
 PCP - Dr. Thressa Flora Cardiologist - denies  PPM/ICD - denies   Chest x-ray - 06/07/22 EKG - n/a Stress Test - denies ECHO - denies Cardiac Cath - denies  Sleep Study - denies   DM- denies  Last dose of GLP1 agonist-  n/a   ASA/Blood Thinner Instructions: n/a   ERAS Protcol - clears until 0800 PRE-SURGERY Ensure given  COVID TEST- n/a   Anesthesia review: no  Patient denies shortness of breath, fever, cough and chest pain at PAT appointment   All instructions explained to the patient, with a verbal understanding of the material. Patient agrees to go over the instructions while at home for a better understanding. The opportunity to ask questions was provided.

## 2024-02-10 ENCOUNTER — Ambulatory Visit (HOSPITAL_COMMUNITY)
Admission: RE | Admit: 2024-02-10 | Discharge: 2024-02-10 | Disposition: A | Discharge status: Home / Self Care | Attending: General Surgery | Admitting: General Surgery

## 2024-02-10 ENCOUNTER — Ambulatory Visit (HOSPITAL_BASED_OUTPATIENT_CLINIC_OR_DEPARTMENT_OTHER)

## 2024-02-10 ENCOUNTER — Ambulatory Visit (HOSPITAL_COMMUNITY)

## 2024-02-10 ENCOUNTER — Encounter (HOSPITAL_COMMUNITY)
Admission: RE | Disposition: A | Discharge status: Home / Self Care | Payer: Self-pay | Source: Home / Self Care | Attending: General Surgery

## 2024-02-10 ENCOUNTER — Other Ambulatory Visit: Payer: Self-pay

## 2024-02-10 DIAGNOSIS — K219 Gastro-esophageal reflux disease without esophagitis: Secondary | ICD-10-CM | POA: Insufficient documentation

## 2024-02-10 DIAGNOSIS — F32A Depression, unspecified: Secondary | ICD-10-CM | POA: Diagnosis not present

## 2024-02-10 DIAGNOSIS — Z9079 Acquired absence of other genital organ(s): Secondary | ICD-10-CM | POA: Insufficient documentation

## 2024-02-10 DIAGNOSIS — Z87891 Personal history of nicotine dependence: Secondary | ICD-10-CM

## 2024-02-10 DIAGNOSIS — J45909 Unspecified asthma, uncomplicated: Secondary | ICD-10-CM | POA: Diagnosis not present

## 2024-02-10 DIAGNOSIS — K4090 Unilateral inguinal hernia, without obstruction or gangrene, not specified as recurrent: Secondary | ICD-10-CM | POA: Insufficient documentation

## 2024-02-10 DIAGNOSIS — I10 Essential (primary) hypertension: Secondary | ICD-10-CM | POA: Diagnosis not present

## 2024-02-10 DIAGNOSIS — D176 Benign lipomatous neoplasm of spermatic cord: Secondary | ICD-10-CM | POA: Insufficient documentation

## 2024-02-10 HISTORY — PX: INGUINAL HERNIA REPAIR: SHX194

## 2024-02-10 SURGERY — REPAIR, HERNIA, INGUINAL, ADULT
Anesthesia: General | Laterality: Left

## 2024-02-10 MED ORDER — OXYCODONE HCL 5 MG PO TABS: 5.0000 mg | ORAL_TABLET | Freq: Once | ORAL | Status: DC | PRN

## 2024-02-10 MED ORDER — PROPOFOL 10 MG/ML IV BOLUS
INTRAVENOUS | Status: AC
  Filled 2024-02-10: qty 20

## 2024-02-10 MED ORDER — PHENYLEPHRINE 80 MCG/ML (10ML) SYRINGE FOR IV PUSH (FOR BLOOD PRESSURE SUPPORT)
PREFILLED_SYRINGE | INTRAVENOUS | Status: AC
  Filled 2024-02-10: qty 10

## 2024-02-10 MED ORDER — FENTANYL CITRATE (PF) 100 MCG/2ML IJ SOLN: 25.0000 ug | INTRAMUSCULAR | Status: DC | PRN

## 2024-02-10 MED ORDER — CHLORHEXIDINE GLUCONATE 0.12 % MT SOLN: 15.0000 mL | Freq: Once | OROMUCOSAL | Status: AC

## 2024-02-10 MED ORDER — ONDANSETRON HCL 4 MG/2ML IJ SOLN
INTRAMUSCULAR | Status: DC | PRN
  Administered 2024-02-10: 4 mg via INTRAVENOUS

## 2024-02-10 MED ORDER — TRAMADOL HCL 50 MG PO TABS: 50.0000 mg | ORAL_TABLET | Freq: Four times a day (QID) | ORAL | 0 refills | Status: AC | PRN

## 2024-02-10 MED ORDER — CEFAZOLIN SODIUM-DEXTROSE 2-4 GM/100ML-% IV SOLN
INTRAVENOUS | Status: AC
Start: 2024-02-10 — End: 2024-02-10
  Filled 2024-02-10: qty 100

## 2024-02-10 MED ORDER — PHENYLEPHRINE HCL-NACL 20-0.9 MG/250ML-% IV SOLN
INTRAVENOUS | Status: DC | PRN
  Administered 2024-02-10: 30 ug/min via INTRAVENOUS

## 2024-02-10 MED ORDER — ORAL CARE MOUTH RINSE: 15.0000 mL | Freq: Once | OROMUCOSAL | Status: AC

## 2024-02-10 MED ORDER — DEXAMETHASONE SODIUM PHOSPHATE 10 MG/ML IJ SOLN
INTRAMUSCULAR | Status: AC
  Filled 2024-02-10: qty 1

## 2024-02-10 MED ORDER — ONDANSETRON HCL 4 MG/2ML IJ SOLN
INTRAMUSCULAR | Status: AC
Start: 2024-02-10 — End: ?
  Filled 2024-02-10: qty 2

## 2024-02-10 MED ORDER — CHLORHEXIDINE GLUCONATE CLOTH 2 % EX PADS: 6.0000 | MEDICATED_PAD | Freq: Once | CUTANEOUS | Status: DC

## 2024-02-10 MED ORDER — ACETAMINOPHEN 500 MG PO TABS: 1000.0000 mg | ORAL_TABLET | Freq: Once | ORAL | Status: DC

## 2024-02-10 MED ORDER — CEFAZOLIN SODIUM-DEXTROSE 2-4 GM/100ML-% IV SOLN
2.0000 g | INTRAVENOUS | Status: AC
Start: 2024-02-10 — End: 2024-02-10
  Administered 2024-02-10: 2 g via INTRAVENOUS

## 2024-02-10 MED ORDER — ONDANSETRON HCL 4 MG/2ML IJ SOLN: 4.0000 mg | Freq: Once | INTRAMUSCULAR | Status: DC | PRN

## 2024-02-10 MED ORDER — DEXAMETHASONE SODIUM PHOSPHATE 10 MG/ML IJ SOLN
INTRAMUSCULAR | Status: DC | PRN
  Administered 2024-02-10: 10 mg via INTRAVENOUS

## 2024-02-10 MED ORDER — PHENYLEPHRINE 80 MCG/ML (10ML) SYRINGE FOR IV PUSH (FOR BLOOD PRESSURE SUPPORT)
PREFILLED_SYRINGE | INTRAVENOUS | Status: DC | PRN
  Administered 2024-02-10 (×2): 160 ug via INTRAVENOUS

## 2024-02-10 MED ORDER — PROPOFOL 10 MG/ML IV BOLUS
INTRAVENOUS | Status: DC | PRN
  Administered 2024-02-10: 50 mg via INTRAVENOUS
  Administered 2024-02-10: 150 mg via INTRAVENOUS

## 2024-02-10 MED ORDER — LACTATED RINGERS IV SOLN: INTRAVENOUS | Status: DC

## 2024-02-10 MED ORDER — MIDAZOLAM HCL 2 MG/2ML IJ SOLN
INTRAMUSCULAR | Status: AC
  Filled 2024-02-10: qty 2

## 2024-02-10 MED ORDER — ACETAMINOPHEN 500 MG PO TABS
ORAL_TABLET | ORAL | Status: AC
  Administered 2024-02-10: 1000 mg via ORAL
  Filled 2024-02-10: qty 2

## 2024-02-10 MED ORDER — MIDAZOLAM HCL 2 MG/2ML IJ SOLN
INTRAMUSCULAR | Status: DC | PRN
  Administered 2024-02-10: 2 mg via INTRAVENOUS

## 2024-02-10 MED ORDER — FENTANYL CITRATE (PF) 250 MCG/5ML IJ SOLN
INTRAMUSCULAR | Status: DC | PRN
  Administered 2024-02-10 (×3): 50 ug via INTRAVENOUS

## 2024-02-10 MED ORDER — LIDOCAINE 2% (20 MG/ML) 5 ML SYRINGE
INTRAMUSCULAR | Status: DC | PRN
  Administered 2024-02-10: 60 mg via INTRAVENOUS

## 2024-02-10 MED ORDER — ENSURE PRE-SURGERY PO LIQD: 296.0000 mL | Freq: Once | ORAL | Status: DC

## 2024-02-10 MED ORDER — BUPIVACAINE-EPINEPHRINE 0.25% -1:200000 IJ SOLN
INTRAMUSCULAR | Status: DC | PRN
  Administered 2024-02-10: 15 mL

## 2024-02-10 MED ORDER — BUPIVACAINE-EPINEPHRINE (PF) 0.25% -1:200000 IJ SOLN
INTRAMUSCULAR | Status: AC
  Filled 2024-02-10: qty 30

## 2024-02-10 MED ORDER — CHLORHEXIDINE GLUCONATE 0.12 % MT SOLN
OROMUCOSAL | Status: AC
  Administered 2024-02-10: 15 mL via OROMUCOSAL
  Filled 2024-02-10: qty 15

## 2024-02-10 MED ORDER — ACETAMINOPHEN 500 MG PO TABS: 1000.0000 mg | ORAL_TABLET | ORAL | Status: AC

## 2024-02-10 MED ORDER — FENTANYL CITRATE (PF) 250 MCG/5ML IJ SOLN
INTRAMUSCULAR | Status: AC
  Filled 2024-02-10: qty 5

## 2024-02-10 MED ORDER — 0.9 % SODIUM CHLORIDE (POUR BTL) OPTIME
TOPICAL | Status: DC | PRN
  Administered 2024-02-10: 1000 mL

## 2024-02-10 MED ORDER — OXYCODONE HCL 5 MG/5ML PO SOLN: 5.0000 mg | Freq: Once | ORAL | Status: DC | PRN

## 2024-02-10 SURGICAL SUPPLY — 36 items
BAG COUNTER SPONGE SURGICOUNT (BAG) ×1 IMPLANT
BLADE CLIPPER SURG (BLADE) IMPLANT
CANISTER SUCT 3000ML PPV (MISCELLANEOUS) IMPLANT
CHLORAPREP W/TINT 26 (MISCELLANEOUS) ×1 IMPLANT
COVER SURGICAL LIGHT HANDLE (MISCELLANEOUS) ×1 IMPLANT
DERMABOND ADVANCED .7 DNX12 (GAUZE/BANDAGES/DRESSINGS) ×1 IMPLANT
DRAIN PENROSE .5X12 LATEX STL (DRAIN) IMPLANT
DRAIN PENROSE 0.5X18 (DRAIN) IMPLANT
DRAPE LAPAROSCOPIC ABDOMINAL (DRAPES) ×1 IMPLANT
ELECTRODE REM PT RTRN 9FT ADLT (ELECTROSURGICAL) ×1 IMPLANT
GAUZE 4X4 16PLY ~~LOC~~+RFID DBL (SPONGE) ×1 IMPLANT
GLOVE BIO SURGEON STRL SZ7.5 (GLOVE) ×2 IMPLANT
GLOVE BIOGEL PI IND STRL 8 (GLOVE) ×1 IMPLANT
GOWN STRL REUS W/ TWL LRG LVL3 (GOWN DISPOSABLE) ×1 IMPLANT
GOWN STRL REUS W/ TWL XL LVL3 (GOWN DISPOSABLE) ×1 IMPLANT
KIT BASIN OR (CUSTOM PROCEDURE TRAY) ×1 IMPLANT
KIT TURNOVER KIT B (KITS) ×1 IMPLANT
MESH PARIETEX PROGRIP LEFT (Mesh General) IMPLANT
NDL HYPO 25GX1X1/2 BEV (NEEDLE) ×1 IMPLANT
NEEDLE HYPO 25GX1X1/2 BEV (NEEDLE) ×1 IMPLANT
NS IRRIG 1000ML POUR BTL (IV SOLUTION) ×1 IMPLANT
PACK GENERAL/GYN (CUSTOM PROCEDURE TRAY) ×1 IMPLANT
PAD ARMBOARD POSITIONER FOAM (MISCELLANEOUS) ×2 IMPLANT
PENCIL SMOKE EVACUATOR (MISCELLANEOUS) ×1 IMPLANT
SPONGE INTESTINAL PEANUT (DISPOSABLE) IMPLANT
SUT MNCRL AB 4-0 PS2 18 (SUTURE) ×1 IMPLANT
SUT PROLENE 2 0 SH DA (SUTURE) ×1 IMPLANT
SUT SILK 0 TIES 10X30 (SUTURE) ×1 IMPLANT
SUT VIC AB 2-0 SH 27X BRD (SUTURE) ×1 IMPLANT
SUT VIC AB 3-0 SH 27XBRD (SUTURE) ×1 IMPLANT
SUT VICRYL AB 2 0 TIES (SUTURE) ×1 IMPLANT
SYR CONTROL 10ML LL (SYRINGE) ×1 IMPLANT
SYRINGE TOOMEY DISP (SYRINGE) ×1 IMPLANT
TOWEL GREEN STERILE (TOWEL DISPOSABLE) ×1 IMPLANT
TOWEL GREEN STERILE FF (TOWEL DISPOSABLE) ×1 IMPLANT
TRAY FOL W/BAG SLVR 16FR STRL (SET/KITS/TRAYS/PACK) IMPLANT

## 2024-02-10 NOTE — Interval H&P Note (Signed)
 History and Physical Interval Note:  02/10/2024 9:36 AM  Patrick Sanchez  has presented today for surgery, with the diagnosis of LEFT INGUINAL HERNIA.  The various methods of treatment have been discussed with the patient and family. After consideration of risks, benefits and other options for treatment, the patient has consented to  Procedure(s) with comments: REPAIR, HERNIA, INGUINAL, ADULT (Left) - OPEN LEFT INGUINAL HERNIA REAPIR WITH MESH as a surgical intervention.  The patient's history has been reviewed, patient examined, no change in status, stable for surgery.  I have reviewed the patient's chart and labs.  Questions were answered to the patient's satisfaction.     Patrick Sanchez

## 2024-02-10 NOTE — Transfer of Care (Signed)
 Immediate Anesthesia Transfer of Care Note  Patient: Patrick Sanchez  Procedure(s) Performed: REPAIR, HERNIA, INGUINAL, ADULT (Left)  Patient Location: PACU  Anesthesia Type:General  Level of Consciousness: awake, alert , oriented, patient cooperative, and responds to stimulation  Airway & Oxygen Therapy: Patient Spontanous Breathing and Patient connected to face mask oxygen  Post-op Assessment: Report given to RN and Post -op Vital signs reviewed and stable  Post vital signs: Reviewed and stable  Last Vitals:  Vitals Value Taken Time  BP 114/63 02/10/24 1123  Temp    Pulse 79 02/10/24 1128  Resp 15 02/10/24 1128  SpO2 93 % 02/10/24 1128  Vitals shown include unfiled device data.  Last Pain:  Vitals:   02/10/24 0929  TempSrc: Oral         Complications: No notable events documented.

## 2024-02-10 NOTE — Anesthesia Procedure Notes (Signed)
 Procedure Name: LMA Insertion Date/Time: 02/10/2024 10:32 AM  Performed by: Katrinka Parr, CRNAPre-anesthesia Checklist: Patient identified, Emergency Drugs available, Suction available and Patient being monitored Patient Re-evaluated:Patient Re-evaluated prior to induction Oxygen Delivery Method: Circle System Utilized Preoxygenation: Pre-oxygenation with 100% oxygen Induction Type: IV induction Ventilation: Mask ventilation without difficulty LMA: LMA inserted LMA Size: 4.0 Number of attempts: 1 Airway Equipment and Method: Bite block Placement Confirmation: positive ETCO2 Tube secured with: Tape Dental Injury: Teeth and Oropharynx as per pre-operative assessment

## 2024-02-10 NOTE — Op Note (Signed)
 02/10/2024  11:10 AM  PATIENT:  Patrick Sanchez  63 y.o. male  PRE-OPERATIVE DIAGNOSIS:  LEFT INGUINAL HERNIA  POST-OPERATIVE DIAGNOSIS:  LEFT INGUINAL HERNIA, cord lipoma  PROCEDURE:  Procedure(s) with comments: REPAIR, HERNIA, INGUINAL, ADULT (Left) - OPEN LEFT INGUINAL HERNIA REAPIR WITH MESH  SURGEON:  Surgeons and Role:    Shela Derby, MD - Primary  ASSISTANTS: Hortense Lyons, RNFA   ANESTHESIA:   local and general  EBL:  minimal   BLOOD ADMINISTERED:none  DRAINS: none   LOCAL MEDICATIONS USED:  BUPIVICAINE   SPECIMEN:  No Specimen  DISPOSITION OF SPECIMEN:  N/A  COUNTS:  YES  TOURNIQUET:  * No tourniquets in log *  DICTATION: .[employer] Dictation  Nadine Aus of the procedure: The patient was taken back to the operating room. The patient was placed in supine position with bilateral SCDs in place. The patient was prepped and draped in the usual sterile fashion.  After appropriate anitbiotics were confirmed, a time-out was confirmed and all facts were verified.  Quarter percent Marcaine  was used to infiltrate the area of the incision and an ilioinguinal nerve block was also placed.   A 5 cm incision was made just 1 cm superior to the inguinal ligament. Bovie cautery was used to maintain hemostasis dissection is carried down to the external oblique.  A standard incision was made laterally, and the external oblique was bluntly dissected away from the surrounding tissue with Metzenbaum scissors. The external oblique was elevated in the spermatic cord was bluntly dissected away from the surrounding tissue.  The ilioinguinal nerve was identified and ligated with an 2-0 dyed vicryl.   The spermatic cord and the hernia were then bluntly dissected away from the pubic tubercle and a Penrose was placed around the hernia sac in the spermatic cord. The vas deferens was identified and protected at all portions of the case. Dissection of the cremasterics took place with Bovie  cautery.  There was no real hernia sac.  There was a large cord lipoma.  This was dissected back to the internal ring.  This was highly ligated using 0 silk.  . This retracted into the abdomen in the usual fashion.  At this time a left-sided Progrip mesh was then anchored to the pubic tubercle with a 2-0 Prolene.  It was anchored to the shelving edge of the external oblique x 1 and the conjoint tendon cephalad x 1.  The wrap around of the mesh was sutured to the conjoint tendon as well.  The new internal ring did not strangulate the spermatic cord.   The tail was then tucked under the external oblique. At this time the area was irrigated out with sterile saline.    The external oblique was reapproximated using a 2-0 Vicryl in a running fashion. Scarpa's fascia was then reapproximated using a 3-0 Vicryl running fashion. The skin was then reapproximated with 4 Monocryl in a subcuticular fashion. The skin was then dressed with Dermabond.  The patient was taken to the recovery room in stable condition.     PLAN OF CARE: Discharge to home after PACU  PATIENT DISPOSITION:  PACU - hemodynamically stable.   Delay start of Pharmacological VTE agent (>24hrs) due to surgical blood loss or risk of bleeding: not applicable

## 2024-02-10 NOTE — Anesthesia Postprocedure Evaluation (Signed)
 Anesthesia Post Note  Patient: Patrick Sanchez  Procedure(s) Performed: REPAIR, HERNIA, INGUINAL, ADULT (Left)     Patient location during evaluation: PACU Anesthesia Type: General Level of consciousness: awake and alert Pain management: pain level controlled Vital Signs Assessment: post-procedure vital signs reviewed and stable Respiratory status: spontaneous breathing, nonlabored ventilation and respiratory function stable Cardiovascular status: stable and blood pressure returned to baseline Anesthetic complications: no   No notable events documented.  Last Vitals:  Vitals:   02/10/24 1145 02/10/24 1200  BP: 114/76 110/68  Pulse: 70 74  Resp: 14 16  Temp:  36.6 C  SpO2: 94% 94%    Last Pain:  Vitals:   02/10/24 1200  TempSrc:   PainSc: 0-No pain                 [name]

## 2024-02-10 NOTE — Discharge Instructions (Signed)

## 2024-02-10 NOTE — Anesthesia Preprocedure Evaluation (Addendum)
 Anesthesia Evaluation  Patient identified by MRN, date of birth, ID band Patient awake    Reviewed: Allergy & Precautions, NPO status , Patient's Chart, lab work & pertinent test results  History of Anesthesia Complications Negative for: history of anesthetic complications  Airway Mallampati: II  TM Distance: >3 FB Neck ROM: Full    Dental  (+) Dental Advisory Given, Teeth Intact   Pulmonary asthma , former smoker   Pulmonary exam normal        Cardiovascular negative cardio ROS Normal cardiovascular exam     Neuro/Psych  PSYCHIATRIC DISORDERS  Depression     Neuromuscular disease    GI/Hepatic ,GERD  Medicated and Controlled,,(+)     substance abuse  alcohol use  Endo/Other  negative endocrine ROS    Renal/GU negative Renal ROS Bladder dysfunction   Prostate cancer     Musculoskeletal  (+) Arthritis ,    Abdominal   Peds  Hematology negative hematology ROS (+)   Anesthesia Other Findings   Reproductive/Obstetrics                             Anesthesia Physical Anesthesia Plan  ASA: 3  Anesthesia Plan: General   Post-op Pain Management: Tylenol  PO (pre-op)*   Induction: Intravenous  PONV Risk Score and Plan: 2 and Treatment may vary due to age or medical condition, Ondansetron , Dexamethasone  and Midazolam   Airway Management Planned: LMA  Additional Equipment: None  Intra-op Plan:   Post-operative Plan: Extubation in OR  Informed Consent: I have reviewed the patients History and Physical, chart, labs and discussed the procedure including the risks, benefits and alternatives for the proposed anesthesia with the patient or authorized representative who has indicated his/her understanding and acceptance.     Dental advisory given  Plan Discussed with: CRNA and Anesthesiologist  Anesthesia Plan Comments:        Anesthesia Quick Evaluation

## 2024-02-11 ENCOUNTER — Encounter (HOSPITAL_COMMUNITY): Payer: Self-pay | Admitting: General Surgery

## 2024-08-26 ENCOUNTER — Ambulatory Visit: Admitting: Orthopedic Surgery

## 2024-11-16 ENCOUNTER — Ambulatory Visit: Payer: Self-pay
